# Patient Record
Sex: Female | Born: 1990 | Race: Black or African American | Hispanic: No | Marital: Single | Smoking: Former smoker
Health system: Southern US, Community
[De-identification: ages and names within clinical notes are randomized; demographics above are authoritative.]

## PROBLEM LIST (undated history)

## (undated) DIAGNOSIS — A64 Unspecified sexually transmitted disease: Secondary | ICD-10-CM

## (undated) DIAGNOSIS — D649 Anemia, unspecified: Secondary | ICD-10-CM

## (undated) DIAGNOSIS — K219 Gastro-esophageal reflux disease without esophagitis: Secondary | ICD-10-CM

## (undated) DIAGNOSIS — E282 Polycystic ovarian syndrome: Secondary | ICD-10-CM

## (undated) DIAGNOSIS — I10 Essential (primary) hypertension: Secondary | ICD-10-CM

## (undated) DIAGNOSIS — D219 Benign neoplasm of connective and other soft tissue, unspecified: Secondary | ICD-10-CM

## (undated) DIAGNOSIS — F419 Anxiety disorder, unspecified: Secondary | ICD-10-CM

## (undated) DIAGNOSIS — R87619 Unspecified abnormal cytological findings in specimens from cervix uteri: Secondary | ICD-10-CM

## (undated) DIAGNOSIS — F32A Depression, unspecified: Secondary | ICD-10-CM

## (undated) HISTORY — DX: Gastro-esophageal reflux disease without esophagitis: K21.9

## (undated) HISTORY — DX: Unspecified sexually transmitted disease: A64

## (undated) HISTORY — DX: Anxiety disorder, unspecified: F41.9

## (undated) HISTORY — DX: Polycystic ovarian syndrome: E28.2

## (undated) HISTORY — DX: Depression, unspecified: F32.A

## (undated) HISTORY — DX: Unspecified abnormal cytological findings in specimens from cervix uteri: R87.619

## (undated) HISTORY — PX: COLPOSCOPY: SHX161

## (undated) HISTORY — DX: Benign neoplasm of connective and other soft tissue, unspecified: D21.9

---

## 2015-03-15 DIAGNOSIS — E282 Polycystic ovarian syndrome: Secondary | ICD-10-CM | POA: Insufficient documentation

## 2016-09-28 DIAGNOSIS — N920 Excessive and frequent menstruation with regular cycle: Secondary | ICD-10-CM | POA: Insufficient documentation

## 2018-04-09 ENCOUNTER — Emergency Department (HOSPITAL_COMMUNITY)
Admission: EM | Admit: 2018-04-09 | Discharge: 2018-04-09 | Disposition: A | Discharge status: Home / Self Care | Payer: Self-pay | Attending: Emergency Medicine | Admitting: Emergency Medicine

## 2018-04-09 DIAGNOSIS — B9789 Other viral agents as the cause of diseases classified elsewhere: Secondary | ICD-10-CM

## 2018-04-09 DIAGNOSIS — J069 Acute upper respiratory infection, unspecified: Secondary | ICD-10-CM | POA: Insufficient documentation

## 2018-04-09 NOTE — Discharge Instructions (Addendum)
You are seen in the ER for cough.  Your symptoms are most likely from a virus that has caused an upper respiratory infection. A viral illness typically peaks on day 2-3 and resolves after one week.     The main treatment approach for a viral upper respiratory infection is to treat the symptoms, support your immune system and prevent spread of illness.    Stay well-hydrated. Rest. You can use over the counter medications to help with symptoms: 600 mg ibuprofen (motrin, aleve, advil) or acetaminophen (tylenol) every 6 hours, around the clock to help with associated fevers, sore throat, headaches, generalized body aches and malaise.  Oxymetazoline (afrin) intranasal spray once daily for no more than 3 days to help with congestion, after 3 days you can switch to another over-the-counter nasal steroid spray such as fluticasone (flonase) Allergy medication (loratadine, cetirizine, etc) and phenylephrine (sudafed) help with nasal congestion, runny nose and postnasal drip.   Dextromethorphan (Delsym) to suppress dry cough  Guaifenesin (mucinex) to help with built up mucus in chest and productive cough Wash your hands often to prevent spread.    A viral upper respiratory infection can also worsen and progress into pneumonia.  Monitor your symptoms. Return for persistent fevers, chest pain, productive cough, inability to tolerate fluids despite nausea medicines or dehydration

## 2018-04-09 NOTE — ED Notes (Signed)
See provider note for assessment

## 2018-04-09 NOTE — ED Provider Notes (Signed)
[facility] EMERGENCY DEPARTMENT Provider Note   CSN: [id number] Arrival date & time: 04/09/18  1751     History   Chief Complaint Chief Complaint  Patient presents with  . URI    HPI Melody Gates is a 28 y.o. female with history of hypertension is here for evaluation of cough.  Onset 4 days ago.  Persistent, dry, hacking in nature.  Associated with nasal congestion, clear rhinorrhea, subjective clamminess, intermittent chest tightness, irritated throat.  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  Has been taken Mucinex which is not helping.  She was afraid to go to work this morning due to her symptoms and came to the ER for evaluation.  She denies fevers, nausea, vomiting, abdominal pain, diarrhea, chest pain or shortness of breath. HPI  No past medical history on file.  There are no active problems to display for this patient.   ** The histories are not reviewed yet. Please review them in the "History" navigator section and refresh this SmartLink.   OB History   No obstetric history on file.      Home Medications    Prior to Admission medications   Not on File    Family History No family history on file.  Social History Social History   Tobacco Use  . Smoking status: Not on file  Substance Use Topics  . Alcohol use: Not on file  . Drug use: Not on file     Allergies   Penicillins and Amoxicillin   Review of Systems Review of Systems  HENT: Positive for congestion, postnasal drip and rhinorrhea.   Respiratory: Positive for cough.   All other systems reviewed and are negative.    Physical Exam Updated Vital Signs BP (!) 141/92 (BP Location: Left Arm)   Pulse 81   Temp 98.6 F (37 C) (Oral)   Resp 16   SpO2 99%   Physical Exam Vitals signs and nursing note reviewed.  Constitutional:      General: She  is not in acute distress.    Appearance: She is well-developed.     Comments: NAD.  HENT:     Head: Normocephalic and atraumatic.     Right Ear: External ear normal.     Left Ear: External ear normal.     Nose: Congestion present.     Comments: Sounds congested.  Moderate mucosal edema, erythema and clear rhinorrhea noted.  No sinus tenderness.    Mouth/Throat:     Comments: Moist mucous membranes.  Oropharynx and tonsils normal. Eyes:     General: No scleral icterus.    Conjunctiva/sclera: Conjunctivae normal.  Neck:     Musculoskeletal: Normal range of motion and neck supple.     Comments: No cervical lymphadenopathy. Cardiovascular:     Rate and Rhythm: Normal rate and regular rhythm.     Heart sounds: Normal heart sounds. No murmur.  Pulmonary:     Effort: Pulmonary effort is normal.     Breath sounds: Normal breath sounds. No wheezing.  Musculoskeletal: Normal range of motion.        General: No deformity.  Skin:    General: Skin is warm and dry.     Capillary Refill: Capillary refill takes less than 2 seconds.  Neurological:     Mental Status: She is alert and oriented to person, place, and time.  Psychiatric:        Behavior: Behavior normal.        Thought Content: Thought content normal.        Judgment: Judgment normal.      ED Treatments / Results  Labs (all labs ordered are listed, but only abnormal results are displayed) Labs Reviewed - No data to display  EKG None  Radiology No results found.  Procedures Procedures (including critical care time)  Medications Ordered in ED Medications - No data to display   Initial Impression / Assessment and Plan / ED Course  I have reviewed the triage vital signs and the nursing notes.  Pertinent labs & imaging results that were available during my care of the patient were reviewed by me and considered in my medical decision making (see chart for details).    28 year old is here with cough that is likely  viral in nature.  She began a new job with kindergarten children the day before symptoms began.  On exam patient is nontoxic, vital signs normal.  No adventitious sounds on lung exam.  I do not think there is indication for emergent chest x-ray given VS, benign exam.  She has no history of immunocompromise.  I doubt bacterial bronchitis or pneumonia.  I considered strep throat to be less likely given her throat exam today, cough and lack of fever.  We will discharge with symptomatic management.  ED return precautions discussed.  Patient is aware that a viral URI infection may precede pneumonia or worsening illness. Patient is aware of red flag symptoms to monitor for that would warrant return to the ED for further reevaluation. She is comfortable with this plan.    Final Clinical Impressions(s) / ED Diagnoses   Final diagnoses:  Viral URI with cough    ED Discharge Orders    None       Liberty Handy, PA-C 04/09/18 [id number]    Alvira Monday, MD 04/10/18 2128

## 2018-04-09 NOTE — ED Triage Notes (Signed)
Pt endorses cough, sore throat, sneezing x 4 days. Afebrile. VSS

## 2019-06-20 ENCOUNTER — Ambulatory Visit: Payer: Medicaid Other | Attending: Internal Medicine

## 2019-06-26 DIAGNOSIS — I10 Essential (primary) hypertension: Secondary | ICD-10-CM | POA: Insufficient documentation

## 2019-12-15 ENCOUNTER — Emergency Department (HOSPITAL_COMMUNITY): Payer: BC Managed Care – PPO

## 2019-12-15 ENCOUNTER — Encounter (HOSPITAL_COMMUNITY): Payer: Self-pay | Admitting: *Deleted

## 2019-12-15 ENCOUNTER — Other Ambulatory Visit: Payer: Self-pay

## 2019-12-15 ENCOUNTER — Emergency Department (HOSPITAL_COMMUNITY)
Admission: EM | Admit: 2019-12-15 | Discharge: 2019-12-16 | Disposition: A | Discharge status: Home / Self Care | Payer: BC Managed Care – PPO | Attending: Emergency Medicine | Admitting: Emergency Medicine

## 2019-12-15 DIAGNOSIS — R10814 Left lower quadrant abdominal tenderness: Secondary | ICD-10-CM | POA: Diagnosis not present

## 2019-12-15 DIAGNOSIS — R10813 Right lower quadrant abdominal tenderness: Secondary | ICD-10-CM | POA: Diagnosis not present

## 2019-12-15 DIAGNOSIS — N939 Abnormal uterine and vaginal bleeding, unspecified: Secondary | ICD-10-CM

## 2019-12-15 DIAGNOSIS — R10812 Left upper quadrant abdominal tenderness: Secondary | ICD-10-CM | POA: Diagnosis not present

## 2019-12-15 DIAGNOSIS — R10811 Right upper quadrant abdominal tenderness: Secondary | ICD-10-CM | POA: Insufficient documentation

## 2019-12-15 DIAGNOSIS — R10815 Periumbilic abdominal tenderness: Secondary | ICD-10-CM | POA: Insufficient documentation

## 2019-12-15 DIAGNOSIS — D500 Iron deficiency anemia secondary to blood loss (chronic): Secondary | ICD-10-CM

## 2019-12-15 DIAGNOSIS — Z20822 Contact with and (suspected) exposure to covid-19: Secondary | ICD-10-CM | POA: Insufficient documentation

## 2019-12-15 DIAGNOSIS — R10816 Epigastric abdominal tenderness: Secondary | ICD-10-CM | POA: Diagnosis not present

## 2019-12-15 DIAGNOSIS — Z87891 Personal history of nicotine dependence: Secondary | ICD-10-CM | POA: Insufficient documentation

## 2019-12-15 DIAGNOSIS — I10 Essential (primary) hypertension: Secondary | ICD-10-CM | POA: Insufficient documentation

## 2019-12-15 DIAGNOSIS — R102 Pelvic and perineal pain: Secondary | ICD-10-CM | POA: Diagnosis present

## 2019-12-15 HISTORY — DX: Anemia, unspecified: D64.9

## 2019-12-15 HISTORY — DX: Essential (primary) hypertension: I10

## 2019-12-15 LAB — CBC WITH DIFFERENTIAL/PLATELET
Abs Immature Granulocytes: 0.14 10*3/uL — ABNORMAL HIGH (ref 0.00–0.07)
Basophils Absolute: 0.1 10*3/uL (ref 0.0–0.1)
Basophils Relative: 0 %
Eosinophils Absolute: 0.1 10*3/uL (ref 0.0–0.5)
Eosinophils Relative: 0 %
HCT: 27.9 % — ABNORMAL LOW (ref 36.0–46.0)
Hemoglobin: 7.9 g/dL — ABNORMAL LOW (ref 12.0–15.0)
Immature Granulocytes: 1 %
Lymphocytes Relative: 9 %
Lymphs Abs: 2.1 10*3/uL (ref 0.7–4.0)
MCH: 20.8 pg — ABNORMAL LOW (ref 26.0–34.0)
MCHC: 28.3 g/dL — ABNORMAL LOW (ref 30.0–36.0)
MCV: 73.6 fL — ABNORMAL LOW (ref 80.0–100.0)
Monocytes Absolute: 0.6 10*3/uL (ref 0.1–1.0)
Monocytes Relative: 3 %
Neutro Abs: 20.4 10*3/uL — ABNORMAL HIGH (ref 1.7–7.7)
Neutrophils Relative %: 87 %
Platelets: 476 10*3/uL — ABNORMAL HIGH (ref 150–400)
RBC: 3.79 MIL/uL — ABNORMAL LOW (ref 3.87–5.11)
RDW: 18.2 % — ABNORMAL HIGH (ref 11.5–15.5)
WBC: 23.3 10*3/uL — ABNORMAL HIGH (ref 4.0–10.5)
nRBC: 0 % (ref 0.0–0.2)

## 2019-12-15 LAB — IRON AND TIBC
Iron: 16 ug/dL — ABNORMAL LOW (ref 28–170)
Saturation Ratios: 4 % — ABNORMAL LOW (ref 10.4–31.8)
TIBC: 416 ug/dL (ref 250–450)
UIBC: 400 ug/dL

## 2019-12-15 LAB — COMPREHENSIVE METABOLIC PANEL
ALT: 11 U/L (ref 0–44)
AST: 12 U/L — ABNORMAL LOW (ref 15–41)
Albumin: 4.4 g/dL (ref 3.5–5.0)
Alkaline Phosphatase: 45 U/L (ref 38–126)
Anion gap: 9 (ref 5–15)
BUN: 14 mg/dL (ref 6–20)
CO2: 23 mmol/L (ref 22–32)
Calcium: 9.5 mg/dL (ref 8.9–10.3)
Chloride: 101 mmol/L (ref 98–111)
Creatinine, Ser: 0.79 mg/dL (ref 0.44–1.00)
GFR calc Af Amer: >60 mL/min (ref >60)
GFR calc non Af Amer: >60 mL/min (ref >60)
Glucose, Bld: 116 mg/dL — ABNORMAL HIGH (ref 70–99)
Potassium: 3.7 mmol/L (ref 3.5–5.1)
Sodium: 133 mmol/L — ABNORMAL LOW (ref 135–145)
Total Bilirubin: 0.4 mg/dL (ref 0.3–1.2)
Total Protein: 8.6 g/dL — ABNORMAL HIGH (ref 6.5–8.1)

## 2019-12-15 LAB — RETICULOCYTES
Immature Retic Fract: 23.9 % — ABNORMAL HIGH (ref 2.3–15.9)
RBC.: 3.76 MIL/uL — ABNORMAL LOW (ref 3.87–5.11)
Retic Count, Absolute: 200 10*3/uL — ABNORMAL HIGH (ref 19.0–186.0)
Retic Ct Pct: 5.3 % — ABNORMAL HIGH (ref 0.4–3.1)

## 2019-12-15 LAB — WET PREP, GENITAL
Clue Cells Wet Prep HPF POC: NONE SEEN
Sperm: NONE SEEN
Trich, Wet Prep: NONE SEEN
Yeast Wet Prep HPF POC: NONE SEEN

## 2019-12-15 LAB — RESPIRATORY PANEL BY RT PCR (FLU A&B, COVID)
Influenza A by PCR: NEGATIVE
Influenza B by PCR: NEGATIVE
SARS Coronavirus 2 by RT PCR: NEGATIVE

## 2019-12-15 LAB — VITAMIN B12: Vitamin B-12: 182 pg/mL (ref 180–914)

## 2019-12-15 LAB — I-STAT BETA HCG BLOOD, ED (MC, WL, AP ONLY): I-stat hCG, quantitative: <5 m[IU]/mL (ref <5)

## 2019-12-15 LAB — SAMPLE TO BLOOD BANK

## 2019-12-15 LAB — FERRITIN: Ferritin: 16 ng/mL (ref 11–307)

## 2019-12-15 LAB — LACTIC ACID, PLASMA: Lactic Acid, Venous: 1.7 mmol/L (ref 0.5–1.9)

## 2019-12-15 LAB — FOLATE: Folate: 5 ng/mL — ABNORMAL LOW (ref >5.9)

## 2019-12-15 LAB — LIPASE, BLOOD: Lipase: 22 U/L (ref 11–51)

## 2019-12-15 MED ORDER — LACTATED RINGERS IV BOLUS
1000.0000 mL | Freq: Once | INTRAVENOUS | Status: AC
  Administered 2019-12-15: 1000 mL via INTRAVENOUS

## 2019-12-15 MED ORDER — NORGESTIMATE-ETH ESTRADIOL 0.25-35 MG-MCG PO TABS: ORAL_TABLET | ORAL | 0 refills | Status: DC

## 2019-12-15 MED ORDER — FERROUS SULFATE 325 (65 FE) MG PO TABS: 325.0000 mg | ORAL_TABLET | Freq: Every day | ORAL | 0 refills | Status: DC

## 2019-12-15 NOTE — ED Notes (Signed)
Pt asleep at this time

## 2019-12-15 NOTE — Discharge Instructions (Addendum)
Please stop taking your previous birth control.  Please go to the pharmacy to pick up the new birth control.  Take 2 pills every day for the next 7 days.  Please call your OB/GYN in the morning to set up a follow-up appointment.  I have given you a prescription for iron also.  Please make sure in addition to that you are taking a multivitamin.

## 2019-12-15 NOTE — ED Provider Notes (Signed)
[facility]-EMERGENCY DEPT Provider Note   CSN: [id number] Arrival date & time: 12/15/19  1552     History Chief Complaint  Patient presents with  . Abdominal Pain    Melody DankerMelissa Gates is a 29 y.o. female with a past medical history of anemia, hypertension, [facility] who presents today for evaluation of pelvic pain.  She reports that today she woke up feeling normal however throughout the day she has developed lower abdominal pain.  She states that it is the same on each side.  It does not radiate or move.  She states that over the past 5 days she has been short of breath.  She states that she has doubled up her iron pills over the past 5 days, however states that she used to get iron infusions however has not had any recently.  She reports that originally she was bleeding for 83 days with large clots.  After she saw OB/GYN and was put on Slynd, a progesterone only birth control pill she stopped for 2 weeks, however over the past 6 weeks has had bleeding daily.   She states that today she is not having significant bleeding, is very light spotting, however while here she was able to feel herself passing clots.  She states that on a normal day she changes an overnight pad once every hour..  She is sexually active, does not use barrier protection such as condoms.  She states that she had a Covid test due to the shortness of breath however that came back negative.  She denies any pain in her chest.  She reports that when she changes position she feels very lightheaded and is unable to walk 5 steps to the bathroom.   HPI     Past Medical History:  Diagnosis Date  . Anemia   . Hypertension     There are no problems to display for this patient.   History reviewed. No pertinent surgical history.   OB History   No obstetric history on file.     No family history on file.  Social History   Tobacco Use  . Smoking status: Former Games developermoker    . Smokeless tobacco: Never Used  Vaping Use  . Vaping Use: Never used  Substance Use Topics  . Alcohol use: Not Currently  . Drug use: Never    Home Medications Prior to Admission medications   Medication Sig Start Date End Date Taking? Authorizing Provider  cholecalciferol (VITAMIN D3) 25 MCG (1000 UNIT) tablet Take 1,000 Units by mouth daily.   Yes [provider]  Ferrous Gluconate (IRON 27 PO) Take 1 tablet by mouth daily.   Yes [provider]  hydrochlorothiazide (HYDRODIURIL) 25 MG tablet Take 25 mg by mouth daily. 12/12/19  Yes [provider]  losartan (COZAAR) 100 MG tablet Take 100 mg by mouth daily. 12/12/19  Yes [provider]  SLYND 4 MG TABS Take 1 tablet by mouth daily. 11/13/19  Yes [provider]  ferrous sulfate 325 (65 FE) MG tablet Take 1 tablet (325 mg total) by mouth daily. 12/15/19   Cristina GongHammond, Yaseen Gilberg W, PA-C  norgestimate-ethinyl estradiol (ORTHO-CYCLEN) 0.25-35 MG-MCG tablet Take 2 tablets by mouth daily for 7 days, THEN 1 tablet daily for 7 days. 12/15/19 12/29/19  Cristina GongHammond, Masie Bermingham W, PA-C    Allergies    Penicillins and Amoxicillin  Review of Systems   Review of Systems  Constitutional: Positive for fatigue. Negative for  chills and fever.  HENT: Negative for congestion.   Eyes: Negative for visual disturbance.  Respiratory: Positive for shortness of breath. Negative for cough and chest tightness.   Cardiovascular: Negative for chest pain, palpitations and leg swelling.  Gastrointestinal: Positive for abdominal pain and nausea. Negative for diarrhea and vomiting.  Genitourinary: Positive for menstrual problem, pelvic pain and vaginal bleeding. Negative for decreased urine volume, dysuria, flank pain, vaginal discharge and vaginal pain.  Musculoskeletal: Negative for back pain and neck pain.  Skin: Negative for color change and rash.  Neurological: Negative for weakness and headaches.  All other systems  reviewed and are negative.   Physical Exam Updated Vital Signs BP (!) 158/90 (BP Location: Right Arm)   Pulse 84   Temp 99.4 F (37.4 C) (Rectal)   Resp 15   Ht 5\' 6"  (1.676 m)   Wt 130.2 kg   SpO2 100%   BMI 46.32 kg/m   Physical Exam Vitals and nursing note reviewed. Exam conducted with a chaperone present.  Constitutional:      General: She is not in acute distress.    Appearance: She is well-developed.  HENT:     Head: Normocephalic and atraumatic.  Eyes:     Conjunctiva/sclera: Conjunctivae normal.  Cardiovascular:     Rate and Rhythm: Normal rate and regular rhythm.     Heart sounds: No murmur heard.   Pulmonary:     Effort: Pulmonary effort is normal. No respiratory distress.     Breath sounds: Normal breath sounds.  Abdominal:     General: Abdomen is flat.     Palpations: Abdomen is soft.     Tenderness: There is abdominal tenderness in the right upper quadrant, right lower quadrant, epigastric area, periumbilical area, suprapubic area, left upper quadrant and left lower quadrant. There is no guarding or rebound.  Genitourinary:    Comments: There is a golf ball sized clot in the vaginal canal.  No active bleeding from the cervix.  Normal external female genitalia.  No pain or fullness in bilateral adnexa.  No cervical motion tenderness. Musculoskeletal:     Cervical back: Neck supple.  Skin:    General: Skin is warm and dry.  Neurological:     General: No focal deficit present.     Mental Status: She is alert.     Cranial Nerves: No cranial nerve deficit.  Psychiatric:        Mood and Affect: Mood normal.        Behavior: Behavior normal.     ED Results / Procedures / Treatments   Labs (all labs ordered are listed, but only abnormal results are displayed) Labs Reviewed  WET PREP, GENITAL - Abnormal; Notable for the following components:      Result Value   WBC, Wet Prep HPF POC FEW (*)    All other components within normal limits  CBC WITH  DIFFERENTIAL/PLATELET - Abnormal; Notable for the following components:   WBC 23.3 (*)    RBC 3.79 (*)    Hemoglobin 7.9 (*)    HCT 27.9 (*)    MCV 73.6 (*)    MCH 20.8 (*)    MCHC 28.3 (*)    RDW 18.2 (*)    Platelets 476 (*)    Neutro Abs 20.4 (*)    Abs Immature Granulocytes 0.14 (*)    All other components within normal limits  COMPREHENSIVE METABOLIC PANEL - Abnormal; Notable for the following components:   Sodium 133 (*)  Glucose, Bld 116 (*)    Total Protein 8.6 (*)    AST 12 (*)    All other components within normal limits  FOLATE - Abnormal; Notable for the following components:   Folate 5.0 (*)    All other components within normal limits  IRON AND TIBC - Abnormal; Notable for the following components:   Iron 16 (*)    Saturation Ratios 4 (*)    All other components within normal limits  RETICULOCYTES - Abnormal; Notable for the following components:   Retic Ct Pct 5.3 (*)    RBC. 3.76 (*)    Retic Count, Absolute 200.0 (*)    Immature Retic Fract 23.9 (*)    All other components within normal limits  RESPIRATORY PANEL BY RT PCR (FLU A&B, COVID)  LIPASE, BLOOD  VITAMIN B12  FERRITIN  LACTIC ACID, PLASMA  URINALYSIS, ROUTINE W REFLEX MICROSCOPIC  I-STAT BETA HCG BLOOD, ED ([facility], [facility], [facility] ONLY)  SAMPLE TO BLOOD BANK  GC/CHLAMYDIA PROBE AMP ([place]) NOT AT [facility]   Patient has records from [facility].  On 10/17/2019 her white counts were 17.8, hemoglobin of 11.0, hematocrit of 36.3, platelets of 391.   EKG EKG Interpretation  Date/Time:  Sunday December 15 2019 17:29:21 EDT Ventricular Rate:  87 PR Interval:    QRS Duration: 82 QT Interval:  405 QTC Calculation: 488 R Axis:   -7 Text Interpretation: Sinus rhythm Borderline prolonged QT interval Confirmed by Donnetta Hutching ([id number]) on 12/15/2019 7:51:29 PM   Radiology DG Chest Port 1 View  Result Date: 12/15/2019 CLINICAL DATA:  29 y.o. female states shortness of breath since Friday 12/13/19. Former  smoker. Hx HTN EXAM: PORTABLE CHEST 1 VIEW COMPARISON:  None. FINDINGS: The cardiomediastinal contours are within normal limits. The lungs are clear. No pneumothorax or significant pleural effusion. No acute finding in the visualized skeleton. IMPRESSION: No acute cardiopulmonary finding. Electronically Signed   By: Emmaline Kluver M.D.   On: 12/15/2019 17:31    Procedures Procedures (including critical care time)  Medications Ordered in ED Medications  lactated ringers bolus 1,000 mL (1,000 mLs Intravenous New Bag/Given 12/15/19 1838)    ED Course  I have reviewed the triage vital signs and the nursing notes.  Pertinent labs & imaging results that were available during my care of the patient were reviewed by me and considered in my medical decision making (see chart for details).  Clinical Course as of Dec 15 2230  Wynelle Link Dec 15, 2019  1723 Patient reports this is a normal for her.  On repeat exam her abdominal pain is significantly improved.  Her abdomen is soft nontender nondistended.  WBC(!): 23.3 [EH]  1753 White 17.8, hemoglob 11.0, crit 36.3, platelets 391 10/17/19   [EH]  1757 Dr. Chestine Spore with [facility] is her primary OB/GYN.   [EH]  1759 Slynd OPC on placebo week, progesterone only pill   [EH]  2155 I spoke with Dr. Henderson Cloud of [facility].  She recommends starting patient on Sprintec, and having her take two pills daily for at least the next few days and call her OB/GYN in the morning.   [EH]    Clinical Course User Index [EH] Norman Clay   MDM Rules/Calculators/A&P                         Patient is a 29 year old woman who presents today for evaluation of abdominal pain worsening since  this morning.  Additionally she reports shortness of breath and fatigue and describes what sounds like orthostatic changes.  On initial exam patient appeared uncomfortable and her abdomen was generally tender, however while in the emergency room prior to  initiation of supportive or symptomatic treatment her pain fully resolved.  Torsion is considered, however given the spontaneous resolution, bilateral nature along with diffuse abdominal pain rather than pelvic or lower abdominal pain this is felt to be less likely.    Labs are obtained and reviewed, she has a leukocytosis of 23.3, however she states that she has chronic leukocytosis, that 23.3 is a normal white count for her, that this has previously been worked up by [employer] at outside facility. She does have prior labs from 10/17/2019 that show her white count was 17.8.  On her labs from 8/5 her hemoglobin was 11.  Here today her hemoglobin is 7.9 and I suspect she has an element of hemoconcentration additionally as her platelets then were 391 and today are 476.  She did have a clot on pelvic exam however is not actively bleeding from the cervix.  I suspect that she has some dehydration from feeling poorly causing a slight hemoconcentration and that this is more of a gradual process than a acute blood loss.  Patient is treated with 1 L of IV fluids.  Her initial orthostatic vitals did not show a significant change in heart rate or blood pressure that was recorded patient did feel symptomatically lightheaded and according to nursing staff her heart rate elevated significantly after she stood for over a minute.  This was observed by myself in the room when patient stood up to get undressed for pelvic exam her heart rate jumped into the 120s.    Patient was treated with 1 L of IV fluids after which she felt better and was much less symptomatic for orthostatics. Anemia panel is sent. I spoke with Dr. Henderson Cloud call for [facility] or patient is established, she recommended stopping patient's progesterone only OCPs, starting her on Sprintec and having her take 2 pills a day for at least the next few days and call her primary OB/GYN in the morning.  At this point patient is currently p.o. challenging.   If patient is able to ambulate in the hallway without difficulty and her urine is unremarkable anticipate discharge home.  If patient becomes symptomatic with ambulation and is unable to safely walk to the bathroom then anticipate transfusing 1 unit of blood prior to discharge.  At shift change care was transferred to Sharilyn Sites who will follow pending studies, if patient is able to ambulate safely discharge patient.   Return precautions were discussed with patient who states their understanding.  Patient denied any unaddressed complaints or concerns.  Patient is agreeable for discharge home.  Note: Portions of this report may have been transcribed using voice recognition software. Every effort was made to ensure accuracy; however, inadvertent computerized transcription errors may be present   Final Clinical Impression(s) / ED Diagnoses Final diagnoses:  Vaginal bleeding  Iron deficiency anemia due to chronic blood loss    Rx / DC Orders ED Discharge Orders         Ordered    norgestimate-ethinyl estradiol (ORTHO-CYCLEN) 0.25-35 MG-MCG tablet        12/15/19 2212    ferrous sulfate 325 (65 FE) MG tablet  Daily        12/15/19 2212    Ambulatory referral to Hematology  12/15/19 2214           Cristina Gong, PA-C 12/15/19 2242    Donnetta Hutching, MD 12/19/19 1655

## 2019-12-15 NOTE — ED Triage Notes (Signed)
Pt BIB EMS and presents abd pain that got worse this morning.  Pt reports being on birth control pills that had her bleeding for 83 days.  Pt had a pause in her bleeding after her OB/GYN switched her medication but the bleeding has resumed x 2 months.  EMS denies N/V but does report bilateral lower abd pain is tender to palpation. Pt reports regular BM and urination.

## 2019-12-16 LAB — URINALYSIS, ROUTINE W REFLEX MICROSCOPIC
Bacteria, UA: NONE SEEN
Bilirubin Urine: NEGATIVE
Glucose, UA: NEGATIVE mg/dL
Ketones, ur: NEGATIVE mg/dL
Leukocytes,Ua: NEGATIVE
Nitrite: NEGATIVE
Protein, ur: NEGATIVE mg/dL
Specific Gravity, Urine: 1.029 (ref 1.005–1.030)
pH: 5 (ref 5.0–8.0)

## 2019-12-16 LAB — GC/CHLAMYDIA PROBE AMP (~~LOC~~) NOT AT ARMC
Chlamydia: NEGATIVE
Comment: NEGATIVE
Comment: NORMAL
Neisseria Gonorrhea: POSITIVE — AB

## 2019-12-16 NOTE — ED Provider Notes (Signed)
  Patient has ambulated here multiple times without issue.  No hypotension or near syncope.  UA without signs of infection.  Plan to d/c home with OB-GYN follow-up as per prior team.  Return here for any new/acute changes.   Garlon Hatchet, PA-C 12/16/19 3785    Geoffery Lyons, MD 12/16/19 [id number]

## 2019-12-16 NOTE — ED Notes (Signed)
Pt ambulated to restroom without incident to provide urine sample 

## 2020-01-06 ENCOUNTER — Telehealth: Payer: Self-pay | Admitting: Oncology

## 2020-01-06 NOTE — Telephone Encounter (Signed)
Received a new hem referral from [facility] ED for iron deficiency. Melody Gates has been scheduled to see Dr. Clelia Croft on 11/17 at 11am. Pt aware to arrive 20 minutes early,

## 2020-01-29 ENCOUNTER — Telehealth: Payer: Self-pay

## 2020-01-29 ENCOUNTER — Other Ambulatory Visit: Payer: Self-pay

## 2020-01-29 ENCOUNTER — Inpatient Hospital Stay: Payer: BC Managed Care – PPO

## 2020-01-29 ENCOUNTER — Inpatient Hospital Stay: Payer: BC Managed Care – PPO | Attending: Oncology | Admitting: Oncology

## 2020-01-29 VITALS — BP 136/79 | HR 94 | Temp 99.3°F | Resp 18 | Ht 66.0 in | Wt 293.3 lb

## 2020-01-29 DIAGNOSIS — I10 Essential (primary) hypertension: Secondary | ICD-10-CM | POA: Diagnosis not present

## 2020-01-29 DIAGNOSIS — Z87891 Personal history of nicotine dependence: Secondary | ICD-10-CM | POA: Insufficient documentation

## 2020-01-29 DIAGNOSIS — D509 Iron deficiency anemia, unspecified: Secondary | ICD-10-CM

## 2020-01-29 DIAGNOSIS — N920 Excessive and frequent menstruation with regular cycle: Secondary | ICD-10-CM | POA: Diagnosis not present

## 2020-01-29 DIAGNOSIS — Z79899 Other long term (current) drug therapy: Secondary | ICD-10-CM | POA: Diagnosis not present

## 2020-01-29 LAB — CBC WITH DIFFERENTIAL (CANCER CENTER ONLY)
Abs Immature Granulocytes: 0.07 10*3/uL (ref 0.00–0.07)
Basophils Absolute: 0.1 10*3/uL (ref 0.0–0.1)
Basophils Relative: 0 %
Eosinophils Absolute: 0.6 10*3/uL — ABNORMAL HIGH (ref 0.0–0.5)
Eosinophils Relative: 4 %
HCT: 23.7 % — ABNORMAL LOW (ref 36.0–46.0)
Hemoglobin: 6.8 g/dL — CL (ref 12.0–15.0)
Immature Granulocytes: 1 %
Lymphocytes Relative: 24 %
Lymphs Abs: 3 10*3/uL (ref 0.7–4.0)
MCH: 18 pg — ABNORMAL LOW (ref 26.0–34.0)
MCHC: 28.7 g/dL — ABNORMAL LOW (ref 30.0–36.0)
MCV: 62.7 fL — ABNORMAL LOW (ref 80.0–100.0)
Monocytes Absolute: 0.6 10*3/uL (ref 0.1–1.0)
Monocytes Relative: 5 %
Neutro Abs: 8.3 10*3/uL — ABNORMAL HIGH (ref 1.7–7.7)
Neutrophils Relative %: 66 %
Platelet Count: 538 10*3/uL — ABNORMAL HIGH (ref 150–400)
RBC: 3.78 MIL/uL — ABNORMAL LOW (ref 3.87–5.11)
RDW: 17.6 % — ABNORMAL HIGH (ref 11.5–15.5)
WBC Count: 12.5 10*3/uL — ABNORMAL HIGH (ref 4.0–10.5)
nRBC: 0 % (ref 0.0–0.2)

## 2020-01-29 LAB — FERRITIN: Ferritin: 10 ng/mL — ABNORMAL LOW (ref 11–307)

## 2020-01-29 LAB — IRON AND TIBC
Iron: 10 ug/dL — ABNORMAL LOW (ref 41–142)
Saturation Ratios: 3 % — ABNORMAL LOW (ref 21–57)
TIBC: 359 ug/dL (ref 236–444)
UIBC: 349 ug/dL (ref 120–384)

## 2020-01-29 NOTE — Telephone Encounter (Signed)
CRITICAL VALUE STICKER  CRITICAL VALUE: Hemoglobin 6.8  RECEIVER (on-site recipient of call): Amanda Pea, RN  DATE & TIME NOTIFIED: 01/29/2020 @ 1117  MESSENGER (representative from lab): Knute Neu  MD NOTIFIED: Dr. Eli Hose  TIME OF NOTIFICATION: 1120  RESPONSE: Acknowledged. No further instructions received at this time.

## 2020-01-29 NOTE — Progress Notes (Signed)
Reason for the request:   Anemia  HPI: I was asked by Lyndel Safe, PA-C   to evaluate Melody Gates with a diagnosis of iron deficiency anemia.  She is a 29 year old woman with history of hypertension and iron deficiency anemia.  She was seen by hematologist in the past and has received intravenous iron because of lack of poor iron absorption previously.  She was seen in the emergency department on December 15, 2019 for abdominal pain and found to have a hemoglobin of 7.9 on a CBC.  Her white cell count was 23 with a platelet count of 476.  Iron studies at that time showed iron level of 16 with a saturation of 4%.  Ferritin was 16 at that time.  CBC obtained on July 7 showed a hemoglobin of 10.3, MCV of 75 and white cell count of 18.5.  Differential showed absolute neutrophilia but no other abnormalities.  She is currently on oral iron replacement although it has not been effective.  She has reported symptoms of fatigue tiredness and occasional dizziness.  She does report ice cravings and does not report any GI bleeding.  She denies hematochezia or melena.  She has reported heavy menstrual cycles but have slowed down after being on Provera.  She has follow-up with gynecology regarding this issue.  She does not report any headaches, blurry vision, syncope or seizures. Does not report any fevers, chills or sweats.  Does not report any cough, wheezing or hemoptysis.  Does not report any chest pain, palpitation, orthopnea or leg edema.  Does not report any nausea, vomiting or abdominal pain.  Does not report any constipation or diarrhea.  Does not report any skeletal complaints.    Does not report frequency, urgency or hematuria.  Does not report any skin rashes or lesions. Does not report any heat or cold intolerance.  Does not report any lymphadenopathy or petechiae.  Does not report any anxiety or depression.  Remaining review of systems is negative.    Past Medical History:  Diagnosis Date  . Anemia   .  Hypertension   :  No past surgical history on file.:   Current Outpatient Medications:  .  cholecalciferol (VITAMIN D3) 25 MCG (1000 UNIT) tablet, Take 1,000 Units by mouth daily., Disp: , Rfl:  .  Ferrous Gluconate (IRON 27 PO), Take 1 tablet by mouth daily., Disp: , Rfl:  .  ferrous sulfate 325 (65 FE) MG tablet, Take 1 tablet (325 mg total) by mouth daily., Disp: 30 tablet, Rfl: 0 .  hydrochlorothiazide (HYDRODIURIL) 25 MG tablet, Take 25 mg by mouth daily., Disp: , Rfl:  .  losartan (COZAAR) 100 MG tablet, Take 100 mg by mouth daily., Disp: , Rfl:  .  norgestimate-ethinyl estradiol (ORTHO-CYCLEN) 0.25-35 MG-MCG tablet, Take 2 tablets by mouth daily for 7 days, THEN 1 tablet daily for 7 days., Disp: 28 tablet, Rfl: 0 .  SLYND 4 MG TABS, Take 1 tablet by mouth daily., Disp: , Rfl: :  Allergies  Allergen Reactions  . Penicillins Swelling  . Amoxicillin   :  No family history on file.:  Social History   Socioeconomic History  . Marital status: Single    Spouse name: Not on file  . Number of children: Not on file  . Years of education: Not on file  . Highest education level: Not on file  Occupational History  . Not on file  Tobacco Use  . Smoking status: Former Games developer  . Smokeless tobacco: Never Used  Vaping Use  . Vaping Use: Never used  Substance and Sexual Activity  . Alcohol use: Not Currently  . Drug use: Never  . Sexual activity: Not on file  Other Topics Concern  . Not on file  Social History Narrative  . Not on file   Social Determinants of Health   Financial Resource Strain:   . Difficulty of Paying Living Expenses: Not on file  Food Insecurity:   . Worried About Programme researcher, broadcasting/film/video in the Last Year: Not on file  . Ran Out of Food in the Last Year: Not on file  Transportation Needs:   . Lack of Transportation (Medical): Not on file  . Lack of Transportation (Non-Medical): Not on file  Physical Activity:   . Days of Exercise per Week: Not on file  .  Minutes of Exercise per Session: Not on file  Stress:   . Feeling of Stress : Not on file  Social Connections:   . Frequency of Communication with Friends and Family: Not on file  . Frequency of Social Gatherings with Friends and Family: Not on file  . Attends Religious Services: Not on file  . Active Member of Clubs or Organizations: Not on file  . Attends Banker Meetings: Not on file  . Marital Status: Not on file  Intimate Partner Violence:   . Fear of Current or Ex-Partner: Not on file  . Emotionally Abused: Not on file  . Physically Abused: Not on file  . Sexually Abused: Not on file  :  Pertinent items are noted in HPI.  Exam: Blood pressure 136/79, pulse 94, temperature 99.3 F (37.4 C), temperature source Tympanic, resp. rate 18, height 5\' 6"  (1.676 m), weight 293 lb 4.8 oz (133 kg), SpO2 100 %.   ECOG 0 General appearance: alert and cooperative appeared without distress. Head: atraumatic without any abnormalities. Eyes: conjunctivae/corneas clear. PERRL.  Sclera anicteric. Throat: lips, mucosa, and tongue normal; without oral thrush or ulcers. Resp: clear to auscultation bilaterally without rhonchi, wheezes or dullness to percussion. Cardio: regular rate and rhythm, S1, S2 normal, no murmur, click, rub or gallop GI: soft, non-tender; bowel sounds normal; no masses,  no organomegaly Skin: Skin color, texture, turgor normal. No rashes or lesions Lymph nodes: Cervical, supraclavicular, and axillary nodes normal. Neurologic: Grossly normal without any motor, sensory or deep tendon reflexes. Musculoskeletal: No joint deformity or effusion.    Assessment and Plan:   29 year old woman with:  1.  Iron deficiency anemia noted in October 2021.  She presented with a hemoglobin of 7.9, iron level of 16 with a ferritin of 16.  Her MCV is 73.6.  The differential diagnosis for these findings were reviewed.  Iron deficiency related to chronic menstrual blood losses  remain the most likely etiology at this time.  Treatment options were reviewed including continuing oral iron therapy versus intravenous iron.  Risks and benefits of IV iron infusion were discussed.  Complications include arthralgias, myalgias and rarely anaphylaxis were reiterated.  After discussion today, she is agreeable to proceed with intravenous iron infusion.  Feraheme formulation will be scheduled with potential for alternative formulation could be used.   2.  Leukocytosis: Most likely reactive in nature related to her iron deficiency and illness.  I do not anticipate a myeloproliferative disorder at this time.  We will continue to monitor on future counts.   3.  Thrombocytosis: Related to her iron deficiency which I anticipate will correct after fixing her deficiency.  4.  Menorrhagia: She has follow-up with OB/GYN regarding this issue and has been on progesterone only control.  5.  Follow-up: Will be in 3 months for repeat follow-up.  45  minutes were dedicated to this visit. The time was spent on reviewing laboratory data, discussing treatment options, discussing differential diagnosis and answering questions regarding future plan.     A copy of this consult has been forwarded to the requesting provider

## 2020-01-31 ENCOUNTER — Telehealth: Payer: Self-pay | Admitting: Oncology

## 2020-01-31 NOTE — Telephone Encounter (Signed)
Scheduled per los, patient has been called and notified. 

## 2020-02-03 ENCOUNTER — Other Ambulatory Visit: Payer: Self-pay | Admitting: Oncology

## 2020-02-07 ENCOUNTER — Inpatient Hospital Stay: Payer: BC Managed Care – PPO

## 2020-02-07 ENCOUNTER — Other Ambulatory Visit: Payer: Self-pay

## 2020-02-07 VITALS — BP 116/83 | HR 94 | Temp 99.5°F | Resp 18

## 2020-02-07 DIAGNOSIS — D509 Iron deficiency anemia, unspecified: Secondary | ICD-10-CM

## 2020-02-07 MED ORDER — SODIUM CHLORIDE 0.9 % IV SOLN
Freq: Once | INTRAVENOUS | Status: AC
  Filled 2020-02-07: qty 250

## 2020-02-07 MED ORDER — SODIUM CHLORIDE 0.9 % IV SOLN
125.0000 mg | Freq: Once | INTRAVENOUS | Status: AC
  Administered 2020-02-07: 125 mg via INTRAVENOUS
  Filled 2020-02-07: qty 10

## 2020-02-07 NOTE — Patient Instructions (Signed)
Sodium Ferric Gluconate Complex injection What is this medicine? SODIUM FERRIC GLUCONATE COMPLEX (SOE dee um FER ik GLOO koe nate KOM pleks) is an iron replacement. It is used with epoetin therapy to treat low iron levels in patients who are receiving hemodialysis. This medicine may be used for other purposes; ask your health care provider or pharmacist if you have questions. COMMON BRAND NAME(S): Ferrlecit, Nulecit What should I tell my health care provider before I take this medicine? They need to know if you have any of the following conditions:  anemia that is not from iron deficiency  high levels of iron in the body  an unusual or allergic reaction to iron, benzyl alcohol, other medicines, foods, dyes, or preservatives  pregnant or are trying to become pregnant  breast-feeding How should I use this medicine? This medicine is for infusion into a vein. It is given by a health care professional in a hospital or clinic setting. Talk to your pediatrician regarding the use of this medicine in children. While this drug may be prescribed for children as young as 6 years old for selected conditions, precautions do apply. Overdosage: If you think you have taken too much of this medicine contact a poison control center or emergency room at once. NOTE: This medicine is only for you. Do not share this medicine with others. What if I miss a dose? It is important not to miss your dose. Call your doctor or health care professional if you are unable to keep an appointment. What may interact with this medicine? Do not take this medicine with any of the following medications:  deferoxamine  dimercaprol  other iron products This medicine may also interact with the following medications:  chloramphenicol  deferasirox  medicine for blood pressure like enalapril This list may not describe all possible interactions. Give your health care provider a list of all the medicines, herbs,  non-prescription drugs, or dietary supplements you use. Also tell them if you smoke, drink alcohol, or use illegal drugs. Some items may interact with your medicine. What should I watch for while using this medicine? Your condition will be monitored carefully while you are receiving this medicine. Visit your doctor for check-ups as directed. What side effects may I notice from receiving this medicine? Side effects that you should report to your doctor or health care professional as soon as possible:  allergic reactions like skin rash, itching or hives, swelling of the face, lips, or tongue  breathing problems  changes in hearing  changes in vision  chills, flushing, or sweating  fast, irregular heartbeat  feeling faint or lightheaded, falls  fever, flu-like symptoms  high or low blood pressure  pain, tingling, numbness in the hands or feet  severe pain in the chest, back, flanks, or groin  swelling of the ankles, feet, hands  trouble passing urine or change in the amount of urine  unusually weak or tired Side effects that usually do not require medical attention (report to your doctor or health care professional if they continue or are bothersome):  cramps  dark colored stools  diarrhea  headache  nausea, vomiting  stomach upset This list may not describe all possible side effects. Call your doctor for medical advice about side effects. You may report side effects to FDA at 1-800-FDA-1088. Where should I keep my medicine? This drug is given in a hospital or clinic and will not be stored at home. NOTE: This sheet is a summary. It may not cover all   possible information. If you have questions about this medicine, talk to your doctor, pharmacist, or health care provider.  2020 Elsevier/Gold Standard (2007-10-31 15:58:57)  

## 2020-02-13 ENCOUNTER — Inpatient Hospital Stay: Payer: BC Managed Care – PPO | Attending: Oncology

## 2020-02-13 ENCOUNTER — Other Ambulatory Visit: Payer: Self-pay

## 2020-02-13 VITALS — BP 133/84 | HR 77 | Temp 98.8°F | Resp 16

## 2020-02-13 DIAGNOSIS — D509 Iron deficiency anemia, unspecified: Secondary | ICD-10-CM | POA: Insufficient documentation

## 2020-02-13 MED ORDER — SODIUM CHLORIDE 0.9 % IV SOLN
125.0000 mg | Freq: Once | INTRAVENOUS | Status: AC
  Administered 2020-02-13: 125 mg via INTRAVENOUS
  Filled 2020-02-13: qty 10

## 2020-02-13 MED ORDER — SODIUM CHLORIDE 0.9 % IV SOLN
Freq: Once | INTRAVENOUS | Status: AC
  Filled 2020-02-13: qty 250

## 2020-02-13 NOTE — Patient Instructions (Signed)
Sodium Ferric Gluconate Complex injection What is this medicine? SODIUM FERRIC GLUCONATE COMPLEX (SOE dee um FER ik GLOO koe nate KOM pleks) is an iron replacement. It is used with epoetin therapy to treat low iron levels in patients who are receiving hemodialysis. This medicine may be used for other purposes; ask your health care provider or pharmacist if you have questions. COMMON BRAND NAME(S): Ferrlecit, Nulecit What should I tell my health care provider before I take this medicine? They need to know if you have any of the following conditions:  anemia that is not from iron deficiency  high levels of iron in the body  an unusual or allergic reaction to iron, benzyl alcohol, other medicines, foods, dyes, or preservatives  pregnant or are trying to become pregnant  breast-feeding How should I use this medicine? This medicine is for infusion into a vein. It is given by a health care professional in a hospital or clinic setting. Talk to your pediatrician regarding the use of this medicine in children. While this drug may be prescribed for children as young as 6 years old for selected conditions, precautions do apply. Overdosage: If you think you have taken too much of this medicine contact a poison control center or emergency room at once. NOTE: This medicine is only for you. Do not share this medicine with others. What if I miss a dose? It is important not to miss your dose. Call your doctor or health care professional if you are unable to keep an appointment. What may interact with this medicine? Do not take this medicine with any of the following medications:  deferoxamine  dimercaprol  other iron products This medicine may also interact with the following medications:  chloramphenicol  deferasirox  medicine for blood pressure like enalapril This list may not describe all possible interactions. Give your health care provider a list of all the medicines, herbs,  non-prescription drugs, or dietary supplements you use. Also tell them if you smoke, drink alcohol, or use illegal drugs. Some items may interact with your medicine. What should I watch for while using this medicine? Your condition will be monitored carefully while you are receiving this medicine. Visit your doctor for check-ups as directed. What side effects may I notice from receiving this medicine? Side effects that you should report to your doctor or health care professional as soon as possible:  allergic reactions like skin rash, itching or hives, swelling of the face, lips, or tongue  breathing problems  changes in hearing  changes in vision  chills, flushing, or sweating  fast, irregular heartbeat  feeling faint or lightheaded, falls  fever, flu-like symptoms  high or low blood pressure  pain, tingling, numbness in the hands or feet  severe pain in the chest, back, flanks, or groin  swelling of the ankles, feet, hands  trouble passing urine or change in the amount of urine  unusually weak or tired Side effects that usually do not require medical attention (report to your doctor or health care professional if they continue or are bothersome):  cramps  dark colored stools  diarrhea  headache  nausea, vomiting  stomach upset This list may not describe all possible side effects. Call your doctor for medical advice about side effects. You may report side effects to FDA at 1-800-FDA-1088. Where should I keep my medicine? This drug is given in a hospital or clinic and will not be stored at home. NOTE: This sheet is a summary. It may not cover all   possible information. If you have questions about this medicine, talk to your doctor, pharmacist, or health care provider.  2020 Elsevier/Gold Standard (2007-10-31 15:58:57)  

## 2020-02-13 NOTE — Progress Notes (Signed)
Pt stable at discharge.  VSS.  No complaints.  Ambulatory to lobby.

## 2020-02-20 ENCOUNTER — Inpatient Hospital Stay: Payer: BC Managed Care – PPO

## 2020-02-20 ENCOUNTER — Other Ambulatory Visit: Payer: Self-pay

## 2020-02-20 VITALS — BP 126/79 | HR 84 | Temp 98.9°F | Resp 18

## 2020-02-20 DIAGNOSIS — D509 Iron deficiency anemia, unspecified: Secondary | ICD-10-CM

## 2020-02-20 MED ORDER — SODIUM CHLORIDE 0.9 % IV SOLN
125.0000 mg | Freq: Once | INTRAVENOUS | Status: AC
  Administered 2020-02-20: 125 mg via INTRAVENOUS
  Filled 2020-02-20: qty 10

## 2020-02-20 MED ORDER — SODIUM CHLORIDE 0.9 % IV SOLN
Freq: Once | INTRAVENOUS | Status: AC
  Filled 2020-02-20: qty 250

## 2020-02-20 NOTE — Progress Notes (Signed)
Patient discharged in stable condition.

## 2020-02-20 NOTE — Patient Instructions (Signed)

## 2020-04-02 ENCOUNTER — Telehealth: Payer: Self-pay | Admitting: Oncology

## 2020-04-02 NOTE — Telephone Encounter (Signed)
Rescheduled 02/18 appointment to 02/09 due to provider pal, patient has been called and voicemail was left.

## 2020-04-22 ENCOUNTER — Inpatient Hospital Stay: Payer: BC Managed Care – PPO | Admitting: Oncology

## 2020-04-22 ENCOUNTER — Inpatient Hospital Stay: Payer: BC Managed Care – PPO

## 2020-04-30 ENCOUNTER — Ambulatory Visit
Admission: RE | Admit: 2020-04-30 | Discharge: 2020-04-30 | Disposition: A | Discharge status: Home / Self Care | Payer: Self-pay | Source: Ambulatory Visit | Attending: Family Medicine | Admitting: Family Medicine

## 2020-04-30 ENCOUNTER — Other Ambulatory Visit: Payer: Self-pay

## 2020-04-30 ENCOUNTER — Ambulatory Visit (INDEPENDENT_AMBULATORY_CARE_PROVIDER_SITE_OTHER): Payer: Medicaid Other

## 2020-04-30 VITALS — BP 150/104 | HR 77 | Temp 98.2°F | Resp 20

## 2020-04-30 DIAGNOSIS — J189 Pneumonia, unspecified organism: Secondary | ICD-10-CM

## 2020-04-30 DIAGNOSIS — R0981 Nasal congestion: Secondary | ICD-10-CM

## 2020-04-30 DIAGNOSIS — R0602 Shortness of breath: Secondary | ICD-10-CM

## 2020-04-30 DIAGNOSIS — R059 Cough, unspecified: Secondary | ICD-10-CM

## 2020-04-30 DIAGNOSIS — R062 Wheezing: Secondary | ICD-10-CM

## 2020-04-30 DIAGNOSIS — Z1152 Encounter for screening for COVID-19: Secondary | ICD-10-CM

## 2020-04-30 MED ORDER — BENZONATATE 100 MG PO CAPS: ORAL_CAPSULE | ORAL | 0 refills | Status: DC

## 2020-04-30 MED ORDER — DOXYCYCLINE HYCLATE 100 MG PO CAPS: 100.0000 mg | ORAL_CAPSULE | Freq: Two times a day (BID) | ORAL | 0 refills | Status: DC

## 2020-04-30 NOTE — ED Triage Notes (Signed)
Patient states she has had a cough and nasal congestion x 9 days. Pt has mild body aches as well. Pt is aox4 and ambulatory.

## 2020-05-01 ENCOUNTER — Ambulatory Visit: Payer: BC Managed Care – PPO | Admitting: Oncology

## 2020-05-01 ENCOUNTER — Other Ambulatory Visit: Payer: BC Managed Care – PPO

## 2020-05-01 LAB — SARS-COV-2, NAA 2 DAY TAT

## 2020-05-01 LAB — NOVEL CORONAVIRUS, NAA: SARS-CoV-2, NAA: NOT DETECTED

## 2020-05-05 NOTE — ED Provider Notes (Signed)
[facility]   [id number] 04/30/20 Arrival Time: 1648  ASSESSMENT & PLAN:  1. Community acquired pneumonia of right lower lobe of lung   2. Encounter for screening for COVID-19   3. Cough   4. Wheezing      COVID-19 testing sent. See letter/work note on file for self-isolation guidelines. OTC symptom care as needed. See AVS for d/c information.  Begin: Meds ordered this encounter  Medications  . doxycycline (VIBRAMYCIN) 100 MG capsule    Sig: Take 1 capsule (100 mg total) by mouth 2 (two) times daily.    Dispense:  20 capsule    Refill:  0  . benzonatate (TESSALON) 100 MG capsule    Sig: Take 1 capsule by mouth every 8 (eight) hours for cough.    Dispense:  21 capsule    Refill:  0     Follow-up Information    [facility] .   Specialty: Urgent Care Why: If worsening or failing to improve as anticipated. Contact information: [phone] [address] [address] [phone]              Reviewed expectations re: course of current medical issues. Questions answered. Outlined signs and symptoms indicating need for more acute intervention. Understanding verbalized. After Visit Summary given.   SUBJECTIVE: History from: patient. Melody Gates is a 30 y.o. female who reports cough and nasal congestion; approx 1.5 w; body aches; cough worsening; prod at times. She is very fatigued. Unsure of fever; reports chills. Denies: difficulty breathing. Normal PO intake without n/v/d. No CP. No LE edema.  Social History   Tobacco Use  Smoking Status Former Smoker  Smokeless Tobacco Never Used     OBJECTIVE:  Vitals:   04/30/20 1708 04/30/20 1738  BP: (!) 150/104   Pulse: 77   Resp: 20   Temp: 98.2 F (36.8 C)   TempSrc: Oral   SpO2: 96% 96%    General appearance: alert; no distress Eyes: PERRLA; EOMI; conjunctiva normal HENT: [place]; AT; with nasal congestion Neck: supple   Lungs: speaks full sentences without difficulty; unlabored; coarse breath sounds bilaterally; no wheezing; active cough Extremities: no edema Skin: warm and dry Neurologic: normal gait Psychological: alert and cooperative; normal mood and affect  Labs: Results for orders placed or performed during the hospital encounter of 04/30/20  Novel Coronavirus, NAA ([facility])   Specimen: Nasopharyngeal Swab; Nasopharyngeal(NP) swabs in vial transport medium   Nasopharynge  Patient  Result Value Ref Range   SARS-CoV-2, NAA Not Detected Not Detected  SARS-COV-2, NAA 2 DAY TAT   Nasopharynge  Patient  Result Value Ref Range   SARS-CoV-2, NAA 2 DAY TAT Performed    Labs Reviewed  NOVEL CORONAVIRUS, NAA   Narrative:    Performed at:  [address] [employer] [address], [place], [place]  [id number] Lab Director: Jolene Schimke MD, Phone:  [phone]  SARS-COV-2, NAA 2 DAY TAT   Narrative:    Performed at:  [address] [facility] [address], [place], [place]  [id number] Lab Director: Jolene Schimke MD, Phone:  [phone]    Imaging: DG Chest 2 View  Result Date: 04/30/2020 CLINICAL DATA:  Wheezing, shortness of breath, cough, and nasal congestion for 9 days EXAM: CHEST - 2 VIEW COMPARISON:  12/15/2019 FINDINGS: Upper normal heart size. Mediastinal contours and pulmonary vascularity normal. Minimal peribronchial thickening with mild infiltrate at RIGHT lung base question pneumonia. Remaining lungs clear. No pleural effusion  or pneumothorax. IMPRESSION: Minimal bronchitic changes with mild RIGHT basilar infiltrate consistent with pneumonia. Electronically Signed   By: Ulyses Southward M.D.   On: 04/30/2020 18:20    Allergies  Allergen Reactions  . Penicillins Swelling  . Amoxicillin     Past Medical History:  Diagnosis Date  . Anemia   . Hypertension    Social History   Socioeconomic History  . Marital status: Single    Spouse name: Not on file  . Number of children: Not on file   . Years of education: Not on file  . Highest education level: Not on file  Occupational History  . Not on file  Tobacco Use  . Smoking status: Former Games developer  . Smokeless tobacco: Never Used  Vaping Use  . Vaping Use: Never used  Substance and Sexual Activity  . Alcohol use: Not Currently  . Drug use: Never  . Sexual activity: Yes  Other Topics Concern  . Not on file  Social History Narrative  . Not on file   Social Determinants of Health   Financial Resource Strain: Not on file  Food Insecurity: Not on file  Transportation Needs: Not on file  Physical Activity: Not on file  Stress: Not on file  Social Connections: Not on file  Intimate Partner Violence: Not on file   History reviewed. No pertinent family history. History reviewed. No pertinent surgical history.   Mardella Layman, MD 05/05/20 [phone]

## 2020-06-04 ENCOUNTER — Inpatient Hospital Stay: Payer: BC Managed Care – PPO | Attending: Oncology

## 2020-06-04 ENCOUNTER — Inpatient Hospital Stay (HOSPITAL_BASED_OUTPATIENT_CLINIC_OR_DEPARTMENT_OTHER): Payer: BC Managed Care – PPO | Admitting: Oncology

## 2020-06-04 ENCOUNTER — Other Ambulatory Visit: Payer: Self-pay

## 2020-06-04 VITALS — BP 147/96 | HR 89 | Temp 98.8°F | Resp 17 | Ht 66.0 in | Wt 299.5 lb

## 2020-06-04 DIAGNOSIS — D75838 Other thrombocytosis: Secondary | ICD-10-CM | POA: Insufficient documentation

## 2020-06-04 DIAGNOSIS — D500 Iron deficiency anemia secondary to blood loss (chronic): Secondary | ICD-10-CM | POA: Insufficient documentation

## 2020-06-04 DIAGNOSIS — D72829 Elevated white blood cell count, unspecified: Secondary | ICD-10-CM | POA: Insufficient documentation

## 2020-06-04 DIAGNOSIS — D509 Iron deficiency anemia, unspecified: Secondary | ICD-10-CM

## 2020-06-04 DIAGNOSIS — N920 Excessive and frequent menstruation with regular cycle: Secondary | ICD-10-CM | POA: Diagnosis present

## 2020-06-04 DIAGNOSIS — Z79899 Other long term (current) drug therapy: Secondary | ICD-10-CM | POA: Insufficient documentation

## 2020-06-04 LAB — CBC WITH DIFFERENTIAL (CANCER CENTER ONLY)
Abs Immature Granulocytes: 0.05 10*3/uL (ref 0.00–0.07)
Basophils Absolute: 0.1 10*3/uL (ref 0.0–0.1)
Basophils Relative: 1 %
Eosinophils Absolute: 0.6 10*3/uL — ABNORMAL HIGH (ref 0.0–0.5)
Eosinophils Relative: 5 %
HCT: 31.5 % — ABNORMAL LOW (ref 36.0–46.0)
Hemoglobin: 8.7 g/dL — ABNORMAL LOW (ref 12.0–15.0)
Immature Granulocytes: 0 %
Lymphocytes Relative: 23 %
Lymphs Abs: 2.9 10*3/uL (ref 0.7–4.0)
MCH: 17.6 pg — ABNORMAL LOW (ref 26.0–34.0)
MCHC: 27.6 g/dL — ABNORMAL LOW (ref 30.0–36.0)
MCV: 63.6 fL — ABNORMAL LOW (ref 80.0–100.0)
Monocytes Absolute: 0.5 10*3/uL (ref 0.1–1.0)
Monocytes Relative: 4 %
Neutro Abs: 8.6 10*3/uL — ABNORMAL HIGH (ref 1.7–7.7)
Neutrophils Relative %: 67 %
Platelet Count: 495 10*3/uL — ABNORMAL HIGH (ref 150–400)
RBC: 4.95 MIL/uL (ref 3.87–5.11)
RDW: 20.3 % — ABNORMAL HIGH (ref 11.5–15.5)
WBC Count: 12.8 10*3/uL — ABNORMAL HIGH (ref 4.0–10.5)
nRBC: 0 % (ref 0.0–0.2)

## 2020-06-04 LAB — IRON AND TIBC
Iron: 17 ug/dL — ABNORMAL LOW (ref 41–142)
Saturation Ratios: 5 % — ABNORMAL LOW (ref 21–57)
TIBC: 366 ug/dL (ref 236–444)
UIBC: 349 ug/dL (ref 120–384)

## 2020-06-04 LAB — FERRITIN: Ferritin: 9 ng/mL — ABNORMAL LOW (ref 11–307)

## 2020-06-04 NOTE — Progress Notes (Signed)
Hematology and Oncology Follow Up Visit  Melody Gates [id number] 1990-10-30 29 y.o. 06/04/2020 9:08 AM Patient, No Pcp Michaelene Song, MD   Principle Diagnosis: 30 year old woman with iron deficiency anemia presented with hemoglobin of 7.9, iron of 16 ferritin of 16 in October 2021.  Her iron deficiency related to chronic menstrual blood losses.   Prior Therapy:  Ferric gluconate given between in February 07, 2020 till February 20, 2020.  Current therapy: Active surveillance.  Interim History: Ms. Giel returns today for a follow-up visit.  Since the last visit, she received intravenous iron infusion without any complications.  She has been on oral iron replacement in the past and has been ineffective.  She denies any nausea, vomiting or hematochezia.  She denies any melena or weight loss.  She continues to have irregular menstrual cycles.     Medications: I have reviewed the patient's current medications.  Current Outpatient Medications  Medication Sig Dispense Refill  . benzonatate (TESSALON) 100 MG capsule Take 1 capsule by mouth every 8 (eight) hours for cough. 21 capsule 0  . cholecalciferol (VITAMIN D3) 25 MCG (1000 UNIT) tablet Take 1,000 Units by mouth daily.    Marland Kitchen doxycycline (VIBRAMYCIN) 100 MG capsule Take 1 capsule (100 mg total) by mouth 2 (two) times daily. 20 capsule 0  . Ferrous Gluconate (IRON 27 PO) Take 1 tablet by mouth daily.    . ferrous sulfate 325 (65 FE) MG tablet Take 1 tablet (325 mg total) by mouth daily. 30 tablet 0  . hydrochlorothiazide (HYDRODIURIL) 25 MG tablet Take 25 mg by mouth daily.    Marland Kitchen losartan (COZAAR) 100 MG tablet Take 100 mg by mouth daily.    . norgestimate-ethinyl estradiol (ORTHO-CYCLEN) 0.25-35 MG-MCG tablet Take 2 tablets by mouth daily for 7 days, THEN 1 tablet daily for 7 days. 28 tablet 0  . SLYND 4 MG TABS Take 1 tablet by mouth daily.     No current facility-administered medications for this visit.     Allergies:   Allergies  Allergen Reactions  . Penicillins Swelling  . Amoxicillin       Physical Exam: Blood pressure (!) 147/96, pulse 89, temperature 98.8 F (37.1 C), temperature source Tympanic, resp. rate 17, height 5\' 6"  (1.676 m), weight 299 lb 8 oz (135.9 kg), SpO2 98 %.   ECOG: 0    General appearance: Comfortable appearing without any discomfort Head: Normocephalic without any trauma Oropharynx: Mucous membranes are moist and pink without any thrush or ulcers. Eyes: Pupils are equal and round reactive to light. Lymph nodes: No cervical, supraclavicular, inguinal or axillary lymphadenopathy.   Heart:regular rate and rhythm.  S1 and S2 without leg edema. Lung: Clear without any rhonchi or wheezes.  No dullness to percussion. Abdomin: Soft, nontender, nondistended with good bowel sounds.  No hepatosplenomegaly. Musculoskeletal: No joint deformity or effusion.  Full range of motion noted. Neurological: No deficits noted on motor, sensory and deep tendon reflex exam. Skin: No petechial rash or dryness.  Appeared moist.      Lab Results: Lab Results  Component Value Date   WBC 12.5 (H) 01/29/2020   HGB 6.8 (LL) 01/29/2020   HCT 23.7 (L) 01/29/2020   MCV 62.7 (L) 01/29/2020   PLT 538 (H) 01/29/2020     Chemistry      Component Value Date/Time   NA 133 (L) 12/15/2019 1657   K 3.7 12/15/2019 1657   CL 101 12/15/2019 1657   CO2 23 12/15/2019 1657   BUN  14 12/15/2019 1657   CREATININE 0.79 12/15/2019 1657      Component Value Date/Time   CALCIUM 9.5 12/15/2019 1657   ALKPHOS 45 12/15/2019 1657   AST 12 (L) 12/15/2019 1657   ALT 11 12/15/2019 1657   BILITOT 0.4 12/15/2019 1657        Impression and Plan:   30 year old woman with:  1.    Anemia related to iron deficiency and chronic menstrual blood losses diagnosed in October 2021.    She is received intravenous iron infusion without any complications completed in December 2021.  Risks and benefits of repeat  intravenous iron in the future were discussed.  Potential complications including arthralgias, myalgias and infusion related issues were reiterated.  Laboratory data reviewed today and showed improvement in her hemoglobin but likely still deficient in iron.  I recommended to repeat intravenous iron infusion in the near future.  She is agreeable to the.   2.  Leukocytosis: Reactive in nature and remains overall mild.  We will continue to monitor on future visits.   3.  Thrombocytosis: Reactive in nature related to iron deficiency and I anticipate improvement with correction of intravenous iron.  Her platelet count today continues to improve with correction of iron levels.  4.  Menorrhagia: She has follow-up with gynecology in the future regarding this issue.  5.  Follow-up: In 4 months for repeat follow-up.  30  minutes were spent on this encounter.  The time was dedicated to reviewing laboratory data, discussing her disease status, treatment options and complications related to her disease and treatment.    Eli Hose, MD 3/24/20229:08 AM

## 2020-06-09 ENCOUNTER — Telehealth: Payer: Self-pay | Admitting: Oncology

## 2020-06-09 NOTE — Telephone Encounter (Signed)
Scheduled per 03/24 los, patient has been called and notified of all upcoming appointments.

## 2020-06-12 ENCOUNTER — Other Ambulatory Visit: Payer: Self-pay

## 2020-06-12 ENCOUNTER — Inpatient Hospital Stay: Payer: BC Managed Care – PPO | Attending: Oncology

## 2020-06-12 VITALS — BP 135/77 | HR 71 | Temp 99.2°F | Resp 17

## 2020-06-12 DIAGNOSIS — N920 Excessive and frequent menstruation with regular cycle: Secondary | ICD-10-CM | POA: Diagnosis present

## 2020-06-12 DIAGNOSIS — D509 Iron deficiency anemia, unspecified: Secondary | ICD-10-CM

## 2020-06-12 DIAGNOSIS — D500 Iron deficiency anemia secondary to blood loss (chronic): Secondary | ICD-10-CM | POA: Insufficient documentation

## 2020-06-12 MED ORDER — SODIUM CHLORIDE 0.9 % IV SOLN
Freq: Once | INTRAVENOUS | Status: AC
  Filled 2020-06-12: qty 250

## 2020-06-12 MED ORDER — SODIUM CHLORIDE 0.9 % IV SOLN
125.0000 mg | Freq: Once | INTRAVENOUS | Status: AC
  Administered 2020-06-12: 125 mg via INTRAVENOUS
  Filled 2020-06-12: qty 10

## 2020-06-12 NOTE — Patient Instructions (Signed)
Sodium Ferric Gluconate Complex injection What is this medicine? SODIUM FERRIC GLUCONATE COMPLEX (SOE dee um FER ik GLOO koe nate KOM pleks) is an iron replacement. It is used with epoetin therapy to treat low iron levels in patients who are receiving hemodialysis. This medicine may be used for other purposes; ask your health care provider or pharmacist if you have questions. COMMON BRAND NAME(S): Ferrlecit, Nulecit What should I tell my health care provider before I take this medicine? They need to know if you have any of the following conditions:  anemia that is not from iron deficiency  high levels of iron in the body  an unusual or allergic reaction to iron, benzyl alcohol, other medicines, foods, dyes, or preservatives  pregnant or are trying to become pregnant  breast-feeding How should I use this medicine? This medicine is for infusion into a vein. It is given by a health care professional in a hospital or clinic setting. Talk to your pediatrician regarding the use of this medicine in children. While this drug may be prescribed for children as young as 6 years old for selected conditions, precautions do apply. Overdosage: If you think you have taken too much of this medicine contact a poison control center or emergency room at once. NOTE: This medicine is only for you. Do not share this medicine with others. What if I miss a dose? It is important not to miss your dose. Call your doctor or health care professional if you are unable to keep an appointment. What may interact with this medicine? Do not take this medicine with any of the following medications:  deferoxamine  dimercaprol  other iron products This medicine may also interact with the following medications:  chloramphenicol  deferasirox  medicine for blood pressure like enalapril This list may not describe all possible interactions. Give your health care provider a list of all the medicines, herbs,  non-prescription drugs, or dietary supplements you use. Also tell them if you smoke, drink alcohol, or use illegal drugs. Some items may interact with your medicine. What should I watch for while using this medicine? Your condition will be monitored carefully while you are receiving this medicine. Visit your doctor for check-ups as directed. What side effects may I notice from receiving this medicine? Side effects that you should report to your doctor or health care professional as soon as possible:  allergic reactions like skin rash, itching or hives, swelling of the face, lips, or tongue  breathing problems  changes in hearing  changes in vision  chills, flushing, or sweating  fast, irregular heartbeat  feeling faint or lightheaded, falls  fever, flu-like symptoms  high or low blood pressure  pain, tingling, numbness in the hands or feet  severe pain in the chest, back, flanks, or groin  swelling of the ankles, feet, hands  trouble passing urine or change in the amount of urine  unusually weak or tired Side effects that usually do not require medical attention (report to your doctor or health care professional if they continue or are bothersome):  cramps  dark colored stools  diarrhea  headache  nausea, vomiting  stomach upset This list may not describe all possible side effects. Call your doctor for medical advice about side effects. You may report side effects to FDA at 1-800-FDA-1088. Where should I keep my medicine? This drug is given in a hospital or clinic and will not be stored at home. NOTE: This sheet is a summary. It may not cover all   possible information. If you have questions about this medicine, talk to your doctor, pharmacist, or health care provider.  2021 Elsevier/Gold Standard (2007-10-31 15:58:57)   

## 2020-06-19 ENCOUNTER — Other Ambulatory Visit: Payer: Self-pay

## 2020-06-19 ENCOUNTER — Inpatient Hospital Stay: Payer: BC Managed Care – PPO

## 2020-06-19 VITALS — BP 134/74 | HR 78 | Temp 98.3°F | Resp 18

## 2020-06-19 DIAGNOSIS — D500 Iron deficiency anemia secondary to blood loss (chronic): Secondary | ICD-10-CM | POA: Diagnosis not present

## 2020-06-19 DIAGNOSIS — D509 Iron deficiency anemia, unspecified: Secondary | ICD-10-CM

## 2020-06-19 MED ORDER — SODIUM CHLORIDE 0.9 % IV SOLN
Freq: Once | INTRAVENOUS | Status: AC
  Filled 2020-06-19: qty 250

## 2020-06-19 MED ORDER — NA FERRIC GLUC CPLX IN SUCROSE 12.5 MG/ML IV SOLN
125.0000 mg | Freq: Once | INTRAVENOUS | Status: AC
  Administered 2020-06-19: 125 mg via INTRAVENOUS
  Filled 2020-06-19: qty 10

## 2020-06-19 NOTE — Patient Instructions (Signed)
Sodium Ferric Gluconate Complex injection What is this medicine? SODIUM FERRIC GLUCONATE COMPLEX (SOE dee um FER ik GLOO koe nate KOM pleks) is an iron replacement. It is used with epoetin therapy to treat low iron levels in patients who are receiving hemodialysis. This medicine may be used for other purposes; ask your health care provider or pharmacist if you have questions. COMMON BRAND NAME(S): Ferrlecit, Nulecit What should I tell my health care provider before I take this medicine? They need to know if you have any of the following conditions:  anemia that is not from iron deficiency  high levels of iron in the body  an unusual or allergic reaction to iron, benzyl alcohol, other medicines, foods, dyes, or preservatives  pregnant or are trying to become pregnant  breast-feeding How should I use this medicine? This medicine is for infusion into a vein. It is given by a health care professional in a hospital or clinic setting. Talk to your pediatrician regarding the use of this medicine in children. While this drug may be prescribed for children as young as 6 years old for selected conditions, precautions do apply. Overdosage: If you think you have taken too much of this medicine contact a poison control center or emergency room at once. NOTE: This medicine is only for you. Do not share this medicine with others. What if I miss a dose? It is important not to miss your dose. Call your doctor or health care professional if you are unable to keep an appointment. What may interact with this medicine? Do not take this medicine with any of the following medications:  deferoxamine  dimercaprol  other iron products This medicine may also interact with the following medications:  chloramphenicol  deferasirox  medicine for blood pressure like enalapril This list may not describe all possible interactions. Give your health care provider a list of all the medicines, herbs,  non-prescription drugs, or dietary supplements you use. Also tell them if you smoke, drink alcohol, or use illegal drugs. Some items may interact with your medicine. What should I watch for while using this medicine? Your condition will be monitored carefully while you are receiving this medicine. Visit your doctor for check-ups as directed. What side effects may I notice from receiving this medicine? Side effects that you should report to your doctor or health care professional as soon as possible:  allergic reactions like skin rash, itching or hives, swelling of the face, lips, or tongue  breathing problems  changes in hearing  changes in vision  chills, flushing, or sweating  fast, irregular heartbeat  feeling faint or lightheaded, falls  fever, flu-like symptoms  high or low blood pressure  pain, tingling, numbness in the hands or feet  severe pain in the chest, back, flanks, or groin  swelling of the ankles, feet, hands  trouble passing urine or change in the amount of urine  unusually weak or tired Side effects that usually do not require medical attention (report to your doctor or health care professional if they continue or are bothersome):  cramps  dark colored stools  diarrhea  headache  nausea, vomiting  stomach upset This list may not describe all possible side effects. Call your doctor for medical advice about side effects. You may report side effects to FDA at 1-800-FDA-1088. Where should I keep my medicine? This drug is given in a hospital or clinic and will not be stored at home. NOTE: This sheet is a summary. It may not cover all   possible information. If you have questions about this medicine, talk to your doctor, pharmacist, or health care provider.  2021 Elsevier/Gold Standard (2007-10-31 15:58:57)   

## 2020-06-26 ENCOUNTER — Other Ambulatory Visit: Payer: Self-pay

## 2020-06-26 ENCOUNTER — Inpatient Hospital Stay: Payer: BC Managed Care – PPO

## 2020-06-26 VITALS — BP 138/67 | HR 67 | Temp 98.7°F | Resp 18

## 2020-06-26 DIAGNOSIS — D509 Iron deficiency anemia, unspecified: Secondary | ICD-10-CM

## 2020-06-26 DIAGNOSIS — D500 Iron deficiency anemia secondary to blood loss (chronic): Secondary | ICD-10-CM | POA: Diagnosis not present

## 2020-06-26 MED ORDER — SODIUM CHLORIDE 0.9 % IV SOLN
125.0000 mg | Freq: Once | INTRAVENOUS | Status: AC
  Administered 2020-06-26: 125 mg via INTRAVENOUS
  Filled 2020-06-26: qty 10

## 2020-06-26 MED ORDER — SODIUM CHLORIDE 0.9 % IV SOLN
Freq: Once | INTRAVENOUS | Status: AC
  Filled 2020-06-26: qty 250

## 2020-06-26 NOTE — Progress Notes (Signed)
Patient declined to stay for 30 minutes post observation period. VSS upon leaving unit.  

## 2020-06-26 NOTE — Patient Instructions (Signed)
Sodium Ferric Gluconate Complex injection What is this medicine? SODIUM FERRIC GLUCONATE COMPLEX (SOE dee um FER ik GLOO koe nate KOM pleks) is an iron replacement. It is used with epoetin therapy to treat low iron levels in patients who are receiving hemodialysis. This medicine may be used for other purposes; ask your health care provider or pharmacist if you have questions. COMMON BRAND NAME(S): Ferrlecit, Nulecit What should I tell my health care provider before I take this medicine? They need to know if you have any of the following conditions:  anemia that is not from iron deficiency  high levels of iron in the body  an unusual or allergic reaction to iron, benzyl alcohol, other medicines, foods, dyes, or preservatives  pregnant or are trying to become pregnant  breast-feeding How should I use this medicine? This medicine is for infusion into a vein. It is given by a health care professional in a hospital or clinic setting. Talk to your pediatrician regarding the use of this medicine in children. While this drug may be prescribed for children as young as 6 years old for selected conditions, precautions do apply. Overdosage: If you think you have taken too much of this medicine contact a poison control center or emergency room at once. NOTE: This medicine is only for you. Do not share this medicine with others. What if I miss a dose? It is important not to miss your dose. Call your doctor or health care professional if you are unable to keep an appointment. What may interact with this medicine? Do not take this medicine with any of the following medications:  deferoxamine  dimercaprol  other iron products This medicine may also interact with the following medications:  chloramphenicol  deferasirox  medicine for blood pressure like enalapril This list may not describe all possible interactions. Give your health care provider a list of all the medicines, herbs,  non-prescription drugs, or dietary supplements you use. Also tell them if you smoke, drink alcohol, or use illegal drugs. Some items may interact with your medicine. What should I watch for while using this medicine? Your condition will be monitored carefully while you are receiving this medicine. Visit your doctor for check-ups as directed. What side effects may I notice from receiving this medicine? Side effects that you should report to your doctor or health care professional as soon as possible:  allergic reactions like skin rash, itching or hives, swelling of the face, lips, or tongue  breathing problems  changes in hearing  changes in vision  chills, flushing, or sweating  fast, irregular heartbeat  feeling faint or lightheaded, falls  fever, flu-like symptoms  high or low blood pressure  pain, tingling, numbness in the hands or feet  severe pain in the chest, back, flanks, or groin  swelling of the ankles, feet, hands  trouble passing urine or change in the amount of urine  unusually weak or tired Side effects that usually do not require medical attention (report to your doctor or health care professional if they continue or are bothersome):  cramps  dark colored stools  diarrhea  headache  nausea, vomiting  stomach upset This list may not describe all possible side effects. Call your doctor for medical advice about side effects. You may report side effects to FDA at 1-800-FDA-1088. Where should I keep my medicine? This drug is given in a hospital or clinic and will not be stored at home. NOTE: This sheet is a summary. It may not cover all   possible information. If you have questions about this medicine, talk to your doctor, pharmacist, or health care provider.  2021 Elsevier/Gold Standard (2007-10-31 15:58:57)   

## 2020-07-03 ENCOUNTER — Other Ambulatory Visit: Payer: Self-pay

## 2020-07-03 ENCOUNTER — Inpatient Hospital Stay: Payer: BC Managed Care – PPO

## 2020-07-03 VITALS — BP 136/82 | HR 79 | Temp 98.0°F | Resp 18

## 2020-07-03 DIAGNOSIS — D509 Iron deficiency anemia, unspecified: Secondary | ICD-10-CM

## 2020-07-03 DIAGNOSIS — D500 Iron deficiency anemia secondary to blood loss (chronic): Secondary | ICD-10-CM | POA: Diagnosis not present

## 2020-07-03 MED ORDER — SODIUM CHLORIDE 0.9 % IV SOLN
125.0000 mg | Freq: Once | INTRAVENOUS | Status: AC
  Administered 2020-07-03: 125 mg via INTRAVENOUS
  Filled 2020-07-03: qty 10

## 2020-07-03 MED ORDER — SODIUM CHLORIDE 0.9 % IV SOLN
Freq: Once | INTRAVENOUS | Status: AC
  Filled 2020-07-03: qty 250

## 2020-07-03 NOTE — Progress Notes (Signed)
Patient declined to stay for 30 minute observation. Vitals stable and patient in no distress upon leaving infusion room.  °

## 2020-07-03 NOTE — Patient Instructions (Signed)
Sodium Ferric Gluconate Complex injection What is this medicine? SODIUM FERRIC GLUCONATE COMPLEX (SOE dee um FER ik GLOO koe nate KOM pleks) is an iron replacement. It is used with epoetin therapy to treat low iron levels in patients who are receiving hemodialysis. This medicine may be used for other purposes; ask your health care provider or pharmacist if you have questions. COMMON BRAND NAME(S): Ferrlecit, Nulecit What should I tell my health care provider before I take this medicine? They need to know if you have any of the following conditions:  anemia that is not from iron deficiency  high levels of iron in the body  an unusual or allergic reaction to iron, benzyl alcohol, other medicines, foods, dyes, or preservatives  pregnant or are trying to become pregnant  breast-feeding How should I use this medicine? This medicine is for infusion into a vein. It is given by a health care professional in a hospital or clinic setting. Talk to your pediatrician regarding the use of this medicine in children. While this drug may be prescribed for children as young as 6 years old for selected conditions, precautions do apply. Overdosage: If you think you have taken too much of this medicine contact a poison control center or emergency room at once. NOTE: This medicine is only for you. Do not share this medicine with others. What if I miss a dose? It is important not to miss your dose. Call your doctor or health care professional if you are unable to keep an appointment. What may interact with this medicine? Do not take this medicine with any of the following medications:  deferoxamine  dimercaprol  other iron products This medicine may also interact with the following medications:  chloramphenicol  deferasirox  medicine for blood pressure like enalapril This list may not describe all possible interactions. Give your health care provider a list of all the medicines, herbs,  non-prescription drugs, or dietary supplements you use. Also tell them if you smoke, drink alcohol, or use illegal drugs. Some items may interact with your medicine. What should I watch for while using this medicine? Your condition will be monitored carefully while you are receiving this medicine. Visit your doctor for check-ups as directed. What side effects may I notice from receiving this medicine? Side effects that you should report to your doctor or health care professional as soon as possible:  allergic reactions like skin rash, itching or hives, swelling of the face, lips, or tongue  breathing problems  changes in hearing  changes in vision  chills, flushing, or sweating  fast, irregular heartbeat  feeling faint or lightheaded, falls  fever, flu-like symptoms  high or low blood pressure  pain, tingling, numbness in the hands or feet  severe pain in the chest, back, flanks, or groin  swelling of the ankles, feet, hands  trouble passing urine or change in the amount of urine  unusually weak or tired Side effects that usually do not require medical attention (report to your doctor or health care professional if they continue or are bothersome):  cramps  dark colored stools  diarrhea  headache  nausea, vomiting  stomach upset This list may not describe all possible side effects. Call your doctor for medical advice about side effects. You may report side effects to FDA at 1-800-FDA-1088. Where should I keep my medicine? This drug is given in a hospital or clinic and will not be stored at home. NOTE: This sheet is a summary. It may not cover all   possible information. If you have questions about this medicine, talk to your doctor, pharmacist, or health care provider.  2021 Elsevier/Gold Standard (2007-10-31 15:58:57)   

## 2020-07-15 LAB — HM PAP SMEAR
HM Pap smear: ABNORMAL
HPV, high-risk: POSITIVE

## 2020-10-02 ENCOUNTER — Inpatient Hospital Stay (HOSPITAL_BASED_OUTPATIENT_CLINIC_OR_DEPARTMENT_OTHER): Payer: BC Managed Care – PPO | Admitting: Oncology

## 2020-10-02 ENCOUNTER — Other Ambulatory Visit: Payer: Self-pay

## 2020-10-02 ENCOUNTER — Inpatient Hospital Stay: Payer: BC Managed Care – PPO | Attending: Oncology

## 2020-10-02 VITALS — BP 161/86 | HR 84 | Temp 99.5°F | Resp 13 | Ht 66.0 in | Wt 286.5 lb

## 2020-10-02 DIAGNOSIS — D509 Iron deficiency anemia, unspecified: Secondary | ICD-10-CM | POA: Diagnosis not present

## 2020-10-02 DIAGNOSIS — N920 Excessive and frequent menstruation with regular cycle: Secondary | ICD-10-CM | POA: Insufficient documentation

## 2020-10-02 DIAGNOSIS — D75839 Thrombocytosis, unspecified: Secondary | ICD-10-CM | POA: Insufficient documentation

## 2020-10-02 DIAGNOSIS — D72829 Elevated white blood cell count, unspecified: Secondary | ICD-10-CM | POA: Insufficient documentation

## 2020-10-02 DIAGNOSIS — D500 Iron deficiency anemia secondary to blood loss (chronic): Secondary | ICD-10-CM | POA: Diagnosis present

## 2020-10-02 LAB — CBC WITH DIFFERENTIAL (CANCER CENTER ONLY)
Abs Immature Granulocytes: 0.04 10*3/uL (ref 0.00–0.07)
Basophils Absolute: 0.1 10*3/uL (ref 0.0–0.1)
Basophils Relative: 1 %
Eosinophils Absolute: 0.5 10*3/uL (ref 0.0–0.5)
Eosinophils Relative: 4 %
HCT: 37.6 % (ref 36.0–46.0)
Hemoglobin: 11.4 g/dL — ABNORMAL LOW (ref 12.0–15.0)
Immature Granulocytes: 0 %
Lymphocytes Relative: 19 %
Lymphs Abs: 2.6 10*3/uL (ref 0.7–4.0)
MCH: 21.7 pg — ABNORMAL LOW (ref 26.0–34.0)
MCHC: 30.3 g/dL (ref 30.0–36.0)
MCV: 71.5 fL — ABNORMAL LOW (ref 80.0–100.0)
Monocytes Absolute: 0.6 10*3/uL (ref 0.1–1.0)
Monocytes Relative: 5 %
Neutro Abs: 10.3 10*3/uL — ABNORMAL HIGH (ref 1.7–7.7)
Neutrophils Relative %: 71 %
Platelet Count: 374 10*3/uL (ref 150–400)
RBC: 5.26 MIL/uL — ABNORMAL HIGH (ref 3.87–5.11)
RDW: 19.9 % — ABNORMAL HIGH (ref 11.5–15.5)
WBC Count: 14.2 10*3/uL — ABNORMAL HIGH (ref 4.0–10.5)
nRBC: 0 % (ref 0.0–0.2)

## 2020-10-02 LAB — FERRITIN: Ferritin: 17 ng/mL (ref 11–307)

## 2020-10-02 LAB — IRON AND TIBC
Iron: 23 ug/dL — ABNORMAL LOW (ref 28–170)
Saturation Ratios: 6 % — ABNORMAL LOW (ref 10.4–31.8)
TIBC: 369 ug/dL (ref 250–450)
UIBC: 346 ug/dL

## 2020-10-02 NOTE — Progress Notes (Signed)
Hematology and Oncology Follow Up Visit  Melody Gates [id number] 1990/09/24 30 y.o. 10/02/2020 8:56 AM Patient, No Pcp Per (Inactive)Anissa Abbs, Blenda Nicely, MD   Principle Diagnosis: 30 year old woman with iron deficiency anemia related to chronic menstrual blood losses noted in October 2021.  She was found to have iron of 16 ferritin of 16.   Prior Therapy:  Ferric gluconate given between in February 07, 2020 till February 20, 2020. Ferric gluconate was given again in April 2022.  Current therapy: Repeat intravenous iron infusion as needed.  Interim History: Ms. Matura presents today for a follow-up evaluation.  Since her last visit, she received intravenous iron infusion in April 2022.  Since that time, she reports continuous improvement in her performance status and quality of life.  She continues to do well have less fatigue and improvement in her exercise tolerance.  She is currently on Provera which helped regulate her cycle without heavy menstrual bleeding.  She still has regular menstrual cycles monthly that lasts close to 6 days.  She has been intolerant to oral iron which also was ineffective.     Medications: Updated on review. Current Outpatient Medications  Medication Sig Dispense Refill   benzonatate (TESSALON) 100 MG capsule Take 1 capsule by mouth every 8 (eight) hours for cough. 21 capsule 0   cholecalciferol (VITAMIN D3) 25 MCG (1000 UNIT) tablet Take 1,000 Units by mouth daily.     doxycycline (VIBRAMYCIN) 100 MG capsule Take 1 capsule (100 mg total) by mouth 2 (two) times daily. 20 capsule 0   hydrochlorothiazide (HYDRODIURIL) 25 MG tablet Take 25 mg by mouth daily.     losartan (COZAAR) 100 MG tablet Take 100 mg by mouth daily.     norgestimate-ethinyl estradiol (ORTHO-CYCLEN) 0.25-35 MG-MCG tablet Take 2 tablets by mouth daily for 7 days, THEN 1 tablet daily for 7 days. 28 tablet 0   SLYND 4 MG TABS Take 1 tablet by mouth daily.     No current facility-administered  medications for this visit.     Allergies:  Allergies  Allergen Reactions   Penicillins Swelling   Amoxicillin       Physical Exam:  Blood pressure (!) 161/86, pulse 84, temperature 99.5 F (37.5 C), temperature source Oral, resp. rate 13, height 5\' 6"  (1.676 m), weight 286 lb 8 oz (130 kg), SpO2 100 %.   ECOG: 0   General appearance: Alert, awake without any distress. Head: Atraumatic without abnormalities Oropharynx: Without any thrush or ulcers. Eyes: No scleral icterus. Lymph nodes: No lymphadenopathy noted in the cervical, supraclavicular, or axillary nodes Heart:regular rate and rhythm, without any murmurs or gallops.   Lung: Clear to auscultation without any rhonchi, wheezes or dullness to percussion. Abdomin: Soft, nontender without any shifting dullness or ascites. Musculoskeletal: No clubbing or cyanosis. Neurological: No motor or sensory deficits. Skin: No rashes or lesions.      Lab Results: Lab Results  Component Value Date   WBC 12.8 (H) 06/04/2020   HGB 8.7 (L) 06/04/2020   HCT 31.5 (L) 06/04/2020   MCV 63.6 (L) 06/04/2020   PLT 495 (H) 06/04/2020     Chemistry      Component Value Date/Time   NA 133 (L) 12/15/2019 1657   K 3.7 12/15/2019 1657   CL 101 12/15/2019 1657   CO2 23 12/15/2019 1657   BUN 14 12/15/2019 1657   CREATININE 0.79 12/15/2019 1657      Component Value Date/Time   CALCIUM 9.5 12/15/2019 1657   ALKPHOS 45  12/15/2019 1657   AST 12 (L) 12/15/2019 1657   ALT 11 12/15/2019 1657   BILITOT 0.4 12/15/2019 1657        Impression and Plan:   30 year old woman with:   1.    Iron deficiency anemia related to menstrual blood losses diagnosed in October 2021.     Her disease status was updated at this time and laboratory data were reviewed.  She continues to receive intravenous iron infusion to replace her deficit.  Risks and benefits of repeat IV iron infusion were discussed.  Potential IV iron formulation were discussed  including iron sucrose as potential formulary replacement with higher doses to achieve iron replacement goals.  Complications that include arthralgias, myalgias and infusion related reaction were discussed.  After discussion today, we opted to proceed with repeat ferric gluconate given her reasonable response.  Her hemoglobin has improved although not normalized.     2.  Leukocytosis: Reactive related to iron deficiency and other factors.  Continues to improve with iron replacement.     3.  Thrombocytosis: Improved at this time with adequate replacement of iron.  Platelet count has normalized at this time.   4.  Menorrhagia: Improved with hormone therapy.   5.  Follow-up: She will return in the next 4 months for repeat follow-up.   30  minutes were dedicated to this encounter.  Time was spent on reviewing laboratory data, disease status update, treatment choices and complications related to therapy.     Eli Hose, MD 7/22/20228:56 AM

## 2020-10-08 ENCOUNTER — Telehealth: Payer: Self-pay | Admitting: Oncology

## 2020-10-08 NOTE — Telephone Encounter (Signed)
Sch per 7/22 los, left message

## 2020-10-09 ENCOUNTER — Ambulatory Visit: Payer: BC Managed Care – PPO

## 2020-10-16 ENCOUNTER — Other Ambulatory Visit: Payer: Self-pay

## 2020-10-16 ENCOUNTER — Inpatient Hospital Stay: Payer: BC Managed Care – PPO | Attending: Oncology

## 2020-10-16 VITALS — BP 131/75 | HR 72 | Temp 99.5°F | Resp 17

## 2020-10-16 DIAGNOSIS — N920 Excessive and frequent menstruation with regular cycle: Secondary | ICD-10-CM | POA: Insufficient documentation

## 2020-10-16 DIAGNOSIS — D500 Iron deficiency anemia secondary to blood loss (chronic): Secondary | ICD-10-CM | POA: Diagnosis present

## 2020-10-16 DIAGNOSIS — D509 Iron deficiency anemia, unspecified: Secondary | ICD-10-CM

## 2020-10-16 MED ORDER — SODIUM CHLORIDE 0.9 % IV SOLN
Freq: Once | INTRAVENOUS | Status: AC
  Filled 2020-10-16: qty 250

## 2020-10-16 MED ORDER — SODIUM CHLORIDE 0.9 % IV SOLN
125.0000 mg | Freq: Once | INTRAVENOUS | Status: AC
  Administered 2020-10-16: 125 mg via INTRAVENOUS
  Filled 2020-10-16: qty 125

## 2020-10-16 NOTE — Patient Instructions (Signed)
Sodium Ferric Gluconate Complex injection What is this medicine? SODIUM FERRIC GLUCONATE COMPLEX (SOE dee um FER ik GLOO koe nate KOM pleks) is an iron replacement. It is used with epoetin therapy to treat low iron levels in patients who are receiving hemodialysis. This medicine may be used for other purposes; ask your health care provider or pharmacist if you have questions. COMMON BRAND NAME(S): Ferrlecit, Nulecit What should I tell my health care provider before I take this medicine? They need to know if you have any of the following conditions:  anemia that is not from iron deficiency  high levels of iron in the body  an unusual or allergic reaction to iron, benzyl alcohol, other medicines, foods, dyes, or preservatives  pregnant or are trying to become pregnant  breast-feeding How should I use this medicine? This medicine is for infusion into a vein. It is given by a health care professional in a hospital or clinic setting. Talk to your pediatrician regarding the use of this medicine in children. While this drug may be prescribed for children as young as 6 years old for selected conditions, precautions do apply. Overdosage: If you think you have taken too much of this medicine contact a poison control center or emergency room at once. NOTE: This medicine is only for you. Do not share this medicine with others. What if I miss a dose? It is important not to miss your dose. Call your doctor or health care professional if you are unable to keep an appointment. What may interact with this medicine? Do not take this medicine with any of the following medications:  deferoxamine  dimercaprol  other iron products This medicine may also interact with the following medications:  chloramphenicol  deferasirox  medicine for blood pressure like enalapril This list may not describe all possible interactions. Give your health care provider a list of all the medicines, herbs,  non-prescription drugs, or dietary supplements you use. Also tell them if you smoke, drink alcohol, or use illegal drugs. Some items may interact with your medicine. What should I watch for while using this medicine? Your condition will be monitored carefully while you are receiving this medicine. Visit your doctor for check-ups as directed. What side effects may I notice from receiving this medicine? Side effects that you should report to your doctor or health care professional as soon as possible:  allergic reactions like skin rash, itching or hives, swelling of the face, lips, or tongue  breathing problems  changes in hearing  changes in vision  chills, flushing, or sweating  fast, irregular heartbeat  feeling faint or lightheaded, falls  fever, flu-like symptoms  high or low blood pressure  pain, tingling, numbness in the hands or feet  severe pain in the chest, back, flanks, or groin  swelling of the ankles, feet, hands  trouble passing urine or change in the amount of urine  unusually weak or tired Side effects that usually do not require medical attention (report to your doctor or health care professional if they continue or are bothersome):  cramps  dark colored stools  diarrhea  headache  nausea, vomiting  stomach upset This list may not describe all possible side effects. Call your doctor for medical advice about side effects. You may report side effects to FDA at 1-800-FDA-1088. Where should I keep my medicine? This drug is given in a hospital or clinic and will not be stored at home. NOTE: This sheet is a summary. It may not cover all   possible information. If you have questions about this medicine, talk to your doctor, pharmacist, or health care provider.  2021 Elsevier/Gold Standard (2007-10-31 15:58:57)   

## 2020-10-23 ENCOUNTER — Inpatient Hospital Stay: Payer: BC Managed Care – PPO

## 2020-10-30 ENCOUNTER — Inpatient Hospital Stay: Payer: BC Managed Care – PPO

## 2020-10-30 ENCOUNTER — Telehealth: Payer: Self-pay | Admitting: *Deleted

## 2020-10-30 ENCOUNTER — Telehealth: Payer: Self-pay

## 2020-10-30 NOTE — Telephone Encounter (Signed)
Patient left a voice mail that she wanted to cancel all her appointments and wanted Dr. Alver Fisher nurse to call her to see if it is ok. She mentioned that she will be out of town for a while. Relayed message to [place] @ [facility]

## 2020-10-30 NOTE — Telephone Encounter (Signed)
I tried to call the patient twice in hopes that I could get in touch with her. I left her a voicemail requesting a call back to inform her that Dr. Clelia Croft approved for the IV iron appointments to be cancelled on 8/19 and 8/26. We are leaving her follow up appointment in November as scheduled.

## 2020-10-30 NOTE — Telephone Encounter (Signed)
-----   Message from Benjiman Core, MD sent at 10/30/2020  1:09 PM EDT ----- Yes. Thanks ----- Message ----- From: Dionicio Stall, CMA Sent: 10/30/2020  12:58 PM EDT To: Benjiman Core, MD  Dr. Clelia Croft,  Patient called reporting that she would like to cancel her upcoming appointments for Ferrlecit today and on 8/26. She left this message with Jaquita Rector a medical secretary with [facility] states that the patient reported to her that she would be out of town for a while.   Patient requested a call back from me to confirm that this was okay with you. I tried calling the patient but had to leave a voicemail. Infusion for today was cancelled, and I will cancel infusion for 8/26 as she will not be in town. Should we keep her Lab & MD appointment for 11/18?  Thank you, Baird Lyons

## 2020-11-06 ENCOUNTER — Inpatient Hospital Stay: Payer: BC Managed Care – PPO

## 2021-01-29 ENCOUNTER — Other Ambulatory Visit: Payer: Self-pay

## 2021-01-29 ENCOUNTER — Inpatient Hospital Stay: Payer: BC Managed Care – PPO | Attending: Oncology

## 2021-01-29 ENCOUNTER — Inpatient Hospital Stay (HOSPITAL_BASED_OUTPATIENT_CLINIC_OR_DEPARTMENT_OTHER): Payer: BC Managed Care – PPO | Admitting: Oncology

## 2021-01-29 ENCOUNTER — Other Ambulatory Visit: Payer: Self-pay | Admitting: *Deleted

## 2021-01-29 VITALS — BP 145/85 | HR 88 | Temp 97.2°F | Resp 18 | Wt 297.1 lb

## 2021-01-29 DIAGNOSIS — D72829 Elevated white blood cell count, unspecified: Secondary | ICD-10-CM | POA: Insufficient documentation

## 2021-01-29 DIAGNOSIS — N920 Excessive and frequent menstruation with regular cycle: Secondary | ICD-10-CM | POA: Diagnosis present

## 2021-01-29 DIAGNOSIS — D509 Iron deficiency anemia, unspecified: Secondary | ICD-10-CM

## 2021-01-29 DIAGNOSIS — D500 Iron deficiency anemia secondary to blood loss (chronic): Secondary | ICD-10-CM | POA: Insufficient documentation

## 2021-01-29 DIAGNOSIS — D75839 Thrombocytosis, unspecified: Secondary | ICD-10-CM | POA: Diagnosis not present

## 2021-01-29 LAB — FERRITIN: Ferritin: 51 ng/mL (ref 11–307)

## 2021-01-29 LAB — CBC WITH DIFFERENTIAL (CANCER CENTER ONLY)
Abs Immature Granulocytes: 0.04 10*3/uL (ref 0.00–0.07)
Basophils Absolute: 0.1 10*3/uL (ref 0.0–0.1)
Basophils Relative: 0 %
Eosinophils Absolute: 0.7 10*3/uL — ABNORMAL HIGH (ref 0.0–0.5)
Eosinophils Relative: 4 %
HCT: 36 % (ref 36.0–46.0)
Hemoglobin: 11 g/dL — ABNORMAL LOW (ref 12.0–15.0)
Immature Granulocytes: 0 %
Lymphocytes Relative: 25 %
Lymphs Abs: 3.8 10*3/uL (ref 0.7–4.0)
MCH: 23 pg — ABNORMAL LOW (ref 26.0–34.0)
MCHC: 30.6 g/dL (ref 30.0–36.0)
MCV: 75.2 fL — ABNORMAL LOW (ref 80.0–100.0)
Monocytes Absolute: 0.8 10*3/uL (ref 0.1–1.0)
Monocytes Relative: 5 %
Neutro Abs: 10.1 10*3/uL — ABNORMAL HIGH (ref 1.7–7.7)
Neutrophils Relative %: 66 %
Platelet Count: 335 10*3/uL (ref 150–400)
RBC: 4.79 MIL/uL (ref 3.87–5.11)
RDW: 17.9 % — ABNORMAL HIGH (ref 11.5–15.5)
WBC Count: 15.5 10*3/uL — ABNORMAL HIGH (ref 4.0–10.5)
nRBC: 0 % (ref 0.0–0.2)

## 2021-01-29 LAB — IRON AND TIBC
Iron: 23 ug/dL — ABNORMAL LOW (ref 41–142)
Saturation Ratios: 8 % — ABNORMAL LOW (ref 21–57)
TIBC: 305 ug/dL (ref 236–444)
UIBC: 282 ug/dL (ref 120–384)

## 2021-01-29 NOTE — Progress Notes (Signed)
Hematology and Oncology Follow Up Visit  Melody Gates [id number] 02-02-1991 30 y.o. 01/29/2021 11:55 AM Patient, No Pcp Per (Inactive)Karisma Meiser, Blenda Nicely, MD   Principle Diagnosis: 30 year old woman with anemia diagnosed in October 2021.  She was found to have iron deficiency due to chronic menstrual losses.   Prior Therapy:  Ferric gluconate given between in February 07, 2020 till February 20, 2020. Ferric gluconate was given again in April 2022.  Current therapy: Active surveillance and under consideration for repeat infusion.  Interim History: Melody Gates returns today for a follow-up visit.  Since the last visit, she reports of feeling well without any recent complaints.  She has reported more fatigue and tiredness however although no menorrhagia or hematochezia.  He denies any dyspnea on exertion or shortness of breath.  Her performance status quality of life remained excellent.      Medications: Reviewed without changes. Current Outpatient Medications  Medication Sig Dispense Refill   benzonatate (TESSALON) 100 MG capsule Take 1 capsule by mouth every 8 (eight) hours for cough. 21 capsule 0   cholecalciferol (VITAMIN D3) 25 MCG (1000 UNIT) tablet Take 1,000 Units by mouth daily.     doxycycline (VIBRAMYCIN) 100 MG capsule Take 1 capsule (100 mg total) by mouth 2 (two) times daily. 20 capsule 0   hydrochlorothiazide (HYDRODIURIL) 25 MG tablet Take 25 mg by mouth daily.     losartan (COZAAR) 100 MG tablet Take 100 mg by mouth daily.     norgestimate-ethinyl estradiol (ORTHO-CYCLEN) 0.25-35 MG-MCG tablet Take 2 tablets by mouth daily for 7 days, THEN 1 tablet daily for 7 days. 28 tablet 0   SLYND 4 MG TABS Take 1 tablet by mouth daily.     No current facility-administered medications for this visit.     Allergies:  Allergies  Allergen Reactions   Penicillins Swelling   Amoxicillin       Physical Exam:  Blood pressure (!) 145/85, pulse 88, temperature (!) 97.2 F (36.2  C), temperature source Tympanic, resp. rate 18, weight 297 lb 1 oz (134.7 kg), SpO2 100 %.   ECOG: 0   General appearance: Comfortable appearing without any discomfort Head: Normocephalic without any trauma Oropharynx: Mucous membranes are moist and pink without any thrush or ulcers. Eyes: Pupils are equal and round reactive to light. Lymph nodes: No cervical, supraclavicular, inguinal or axillary lymphadenopathy.   Heart:regular rate and rhythm.  S1 and S2 without leg edema. Lung: Clear without any rhonchi or wheezes.  No dullness to percussion. Abdomin: Soft, nontender, nondistended with good bowel sounds.  No hepatosplenomegaly. Musculoskeletal: No joint deformity or effusion.  Full range of motion noted. Neurological: No deficits noted on motor, sensory and deep tendon reflex exam. Skin: No petechial rash or dryness.  Appeared moist.        Lab Results: Lab Results  Component Value Date   WBC 15.5 (H) 01/29/2021   HGB 11.0 (L) 01/29/2021   HCT 36.0 01/29/2021   MCV 75.2 (L) 01/29/2021   PLT 335 01/29/2021     Chemistry      Component Value Date/Time   NA 133 (L) 12/15/2019 1657   K 3.7 12/15/2019 1657   CL 101 12/15/2019 1657   CO2 23 12/15/2019 1657   BUN 14 12/15/2019 1657   CREATININE 0.79 12/15/2019 1657      Component Value Date/Time   CALCIUM 9.5 12/15/2019 1657   ALKPHOS 45 12/15/2019 1657   AST 12 (L) 12/15/2019 1657   ALT 11 12/15/2019 1657  BILITOT 0.4 12/15/2019 1657        Impression and Plan:   30 year old woman with:   1.   Anemia related to iron deficiency and chronic menstrual blood losses diagnosed in 2021.     She is currently receiving intravenous iron infusion given her poor tolerance to oral iron and and effectiveness.  Laboratory data from today reviewed and shows slight decline in in her hemoglobin and continues to have low MCV and elevated RDW.  These findings likely consistent with persistent iron deficiency.  Risks and  benefits of repeat intravenous iron infusion were discussed.  Complications that include nausea, fatigue, infusion related issues were discussed.  After discussion he is agreeable to proceed and we will arrange for iron sucrose in the near future for a total of 800 mg.     2.  Leukocytosis: Appears to be reactive in nature without any evidence of a hematological disorder.     3.  Thrombocytosis: Related to iron deficiency and improved at this time.   4.  Menorrhagia: Last menstrual cycle has been few months ago and managed on hormone therapy.   5.  Follow-up: In 4 months for repeat follow-up.   30  minutes were spent on this visit.  Time was dedicated to reviewing laboratory data, disease status update and outlining future plan of care.     Eli Hose, MD 11/18/202211:55 AM

## 2021-02-08 ENCOUNTER — Inpatient Hospital Stay: Payer: BC Managed Care – PPO

## 2021-02-08 ENCOUNTER — Other Ambulatory Visit: Payer: Self-pay

## 2021-02-08 ENCOUNTER — Encounter: Payer: Self-pay | Admitting: Oncology

## 2021-02-08 VITALS — BP 138/71 | HR 72 | Temp 98.8°F | Resp 16 | Wt 300.0 lb

## 2021-02-08 DIAGNOSIS — D509 Iron deficiency anemia, unspecified: Secondary | ICD-10-CM

## 2021-02-08 DIAGNOSIS — D500 Iron deficiency anemia secondary to blood loss (chronic): Secondary | ICD-10-CM | POA: Diagnosis not present

## 2021-02-08 MED ORDER — SODIUM CHLORIDE 0.9 % IV SOLN: Freq: Once | INTRAVENOUS | Status: AC

## 2021-02-08 MED ORDER — SODIUM CHLORIDE 0.9 % IV SOLN
200.0000 mg | Freq: Once | INTRAVENOUS | Status: AC
  Administered 2021-02-08: 15:00:00 200 mg via INTRAVENOUS
  Filled 2021-02-08: qty 200

## 2021-02-08 NOTE — Patient Instructions (Signed)

## 2021-02-16 ENCOUNTER — Telehealth: Payer: Self-pay | Admitting: Oncology

## 2021-02-16 NOTE — Telephone Encounter (Signed)
Scheduled per sch msg. Called and spoke with patient. Confirmed appts  

## 2021-02-19 ENCOUNTER — Inpatient Hospital Stay: Payer: BC Managed Care – PPO | Attending: Oncology

## 2021-02-19 ENCOUNTER — Other Ambulatory Visit: Payer: Self-pay

## 2021-02-19 VITALS — BP 138/64 | HR 66 | Temp 98.4°F | Resp 18

## 2021-02-19 DIAGNOSIS — D509 Iron deficiency anemia, unspecified: Secondary | ICD-10-CM

## 2021-02-19 DIAGNOSIS — D500 Iron deficiency anemia secondary to blood loss (chronic): Secondary | ICD-10-CM | POA: Diagnosis not present

## 2021-02-19 MED ORDER — SODIUM CHLORIDE 0.9 % IV SOLN
200.0000 mg | Freq: Once | INTRAVENOUS | Status: AC
  Administered 2021-02-19: 200 mg via INTRAVENOUS
  Filled 2021-02-19: qty 200

## 2021-02-19 MED ORDER — SODIUM CHLORIDE 0.9 % IV SOLN: Freq: Once | INTRAVENOUS | Status: AC

## 2021-02-19 NOTE — Progress Notes (Signed)
Pt declined to stay for entire 30 min post observation, observed for 15 min, discharged with VSS.

## 2021-02-19 NOTE — Patient Instructions (Signed)

## 2021-02-26 ENCOUNTER — Other Ambulatory Visit: Payer: Self-pay

## 2021-02-26 ENCOUNTER — Inpatient Hospital Stay: Payer: BC Managed Care – PPO

## 2021-02-26 VITALS — BP 144/100 | HR 84 | Temp 98.9°F | Resp 18

## 2021-02-26 DIAGNOSIS — D509 Iron deficiency anemia, unspecified: Secondary | ICD-10-CM

## 2021-02-26 DIAGNOSIS — D500 Iron deficiency anemia secondary to blood loss (chronic): Secondary | ICD-10-CM | POA: Diagnosis not present

## 2021-02-26 MED ORDER — SODIUM CHLORIDE 0.9 % IV SOLN: Freq: Once | INTRAVENOUS | Status: AC

## 2021-02-26 MED ORDER — SODIUM CHLORIDE 0.9 % IV SOLN
200.0000 mg | Freq: Once | INTRAVENOUS | Status: AC
  Administered 2021-02-26: 200 mg via INTRAVENOUS
  Filled 2021-02-26: qty 200

## 2021-02-26 NOTE — Progress Notes (Signed)
Patient tolerated IV iron infusion well. No complaints, declined to stayed for 30 minute post-observation period. VSS, ambulatory to lobby.

## 2021-02-26 NOTE — Patient Instructions (Signed)

## 2021-03-05 ENCOUNTER — Inpatient Hospital Stay: Payer: BC Managed Care – PPO

## 2021-03-09 ENCOUNTER — Telehealth: Payer: Self-pay | Admitting: Oncology

## 2021-03-09 NOTE — Telephone Encounter (Signed)
R/s infusion per 12/26 pt message due to pt tested covid + on 12/25. Pt has been called and confirmed new appt

## 2021-03-12 ENCOUNTER — Inpatient Hospital Stay: Payer: BC Managed Care – PPO

## 2021-03-23 ENCOUNTER — Telehealth: Payer: Self-pay | Admitting: Oncology

## 2021-03-23 NOTE — Telephone Encounter (Signed)
Rescheduled per 1/10 sch msg, message has been left with pt

## 2021-03-29 ENCOUNTER — Inpatient Hospital Stay: Payer: BC Managed Care – PPO

## 2021-03-29 ENCOUNTER — Other Ambulatory Visit: Payer: Self-pay

## 2021-03-29 ENCOUNTER — Inpatient Hospital Stay: Payer: BC Managed Care – PPO | Attending: Oncology

## 2021-03-29 VITALS — BP 142/88 | HR 93 | Temp 98.4°F | Resp 18

## 2021-03-29 DIAGNOSIS — D509 Iron deficiency anemia, unspecified: Secondary | ICD-10-CM

## 2021-03-29 DIAGNOSIS — D500 Iron deficiency anemia secondary to blood loss (chronic): Secondary | ICD-10-CM | POA: Insufficient documentation

## 2021-03-29 MED ORDER — SODIUM CHLORIDE 0.9 % IV SOLN: Freq: Once | INTRAVENOUS | Status: AC

## 2021-03-29 MED ORDER — SODIUM CHLORIDE 0.9 % IV SOLN
200.0000 mg | Freq: Once | INTRAVENOUS | Status: AC
  Administered 2021-03-29: 200 mg via INTRAVENOUS
  Filled 2021-03-29: qty 200

## 2021-03-29 NOTE — Progress Notes (Signed)
Patient declined to stay for observation period following iron infusion. Vital signs stable at discharge.

## 2021-03-29 NOTE — Patient Instructions (Signed)

## 2021-05-26 ENCOUNTER — Encounter (HOSPITAL_COMMUNITY): Payer: Self-pay

## 2021-05-26 ENCOUNTER — Emergency Department (HOSPITAL_COMMUNITY)
Admission: EM | Admit: 2021-05-26 | Discharge: 2021-05-26 | Disposition: A | Discharge status: Home / Self Care | Payer: BC Managed Care – PPO | Attending: Emergency Medicine | Admitting: Emergency Medicine

## 2021-05-26 DIAGNOSIS — K0381 Cracked tooth: Secondary | ICD-10-CM | POA: Diagnosis not present

## 2021-05-26 DIAGNOSIS — K029 Dental caries, unspecified: Secondary | ICD-10-CM | POA: Diagnosis not present

## 2021-05-26 DIAGNOSIS — K0889 Other specified disorders of teeth and supporting structures: Secondary | ICD-10-CM | POA: Diagnosis present

## 2021-05-26 MED ORDER — ONDANSETRON 4 MG PO TBDP
4.0000 mg | ORAL_TABLET | Freq: Once | ORAL | Status: AC
Start: 2021-05-26 — End: 2021-05-26
  Administered 2021-05-26: 4 mg via ORAL

## 2021-05-26 MED ORDER — OXYCODONE-ACETAMINOPHEN 5-325 MG PO TABS: 1.0000 | ORAL_TABLET | Freq: Four times a day (QID) | ORAL | 0 refills | Status: DC | PRN

## 2021-05-26 MED ORDER — OXYCODONE-ACETAMINOPHEN 5-325 MG PO TABS
1.0000 | ORAL_TABLET | Freq: Once | ORAL | Status: AC
  Administered 2021-05-26: 1 via ORAL
  Filled 2021-05-26: qty 1

## 2021-05-26 NOTE — Discharge Instructions (Addendum)
Follow up with your dentist  ?Return immediately back to the ER if: ? ?Your symptoms worsen within the next 12-24 hours. ?You develop new symptoms such as new fevers, persistent vomiting, new pain, shortness of breath, or new weakness or numbness, or if you have any other concerns. ? ?

## 2021-05-26 NOTE — ED Triage Notes (Signed)
Pt arrived via POV c/o left sided dental pain. Was seen by dentist, given abx, current day three of abx with no improvement. Pain worsening.  ?

## 2021-05-26 NOTE — ED Notes (Signed)
Patient dry heaving when attempting to administer PO pain medication, MD aware.   ?

## 2021-05-26 NOTE — ED Provider Notes (Signed)
?[facility]-EMERGENCY DEPT ?Provider Note ? ? ?CSN: [id number] ?Arrival date & time: 05/26/21  1743 ? ?  ? ?History ? ?Chief Complaint  ?Patient presents with  ? Dental Pain  ? ? ?Melody Gates is a 31 y.o. female. ? ?Patient presents with complaint of dental pain.  She states has had dental pain off and on for several years.  She just saw her dentist and work out a payment plan and they are planning on extraction.  She was started on antibiotic which he has been taking for 3 days, still having some dental pain.  Taking ibuprofen at home as well without significant improvement.  Denies fevers or cough or vomiting or diarrhea. ? ? ?  ? ?Home Medications ?Prior to Admission medications   ?Medication Sig Start Date End Date Taking? Authorizing Provider  ?oxyCODONE-acetaminophen (PERCOCET/ROXICET) 5-325 MG tablet Take 1 tablet by mouth every 6 (six) hours as needed for up to 8 doses for severe pain. 05/26/21  Yes Cheryll Cockayne, MD  ?benzonatate (TESSALON) 100 MG capsule Take 1 capsule by mouth every 8 (eight) hours for cough. 04/30/20   Mardella Layman, MD  ?cholecalciferol (VITAMIN D3) 25 MCG (1000 UNIT) tablet Take 1,000 Units by mouth daily.    [provider]  ?clindamycin (CLEOCIN) 300 MG capsule Take 300 mg by mouth 2 (two) times daily. 02/18/21   [provider]  ?doxycycline (VIBRAMYCIN) 100 MG capsule Take 1 capsule (100 mg total) by mouth 2 (two) times daily. 04/30/20   Mardella Layman, MD  ?hydrochlorothiazide (HYDRODIURIL) 25 MG tablet Take 25 mg by mouth daily. 12/12/19   [provider]  ?losartan (COZAAR) 100 MG tablet Take 100 mg by mouth daily. 12/12/19   [provider]  ?naproxen (NAPROSYN) 500 MG tablet Take 500 mg by mouth 2 (two) times daily. 12/09/20   [provider]  ?norgestimate-ethinyl estradiol (ORTHO-CYCLEN) 0.25-35 MG-MCG tablet Take 2 tablets by mouth daily for 7 days, THEN 1 tablet daily for 7 days. 12/15/19 12/29/19  Cristina Gong, PA-C  ?SLYND 4 MG TABS Take 1 tablet by mouth daily. 11/13/19   [provider]  ?   ? ?Allergies    ?Penicillins and Amoxicillin   ? ?Review of Systems   ?Review of Systems  ?Constitutional:  Negative for fever.  ?HENT:  Negative for ear pain.   ?Eyes:  Negative for pain.  ?Respiratory:  Negative for cough.   ?Cardiovascular:  Negative for chest pain.  ?Gastrointestinal:  Negative for abdominal pain.  ?Genitourinary:  Negative for flank pain.  ?Musculoskeletal:  Negative for back pain.  ?Skin:  Negative for rash.  ?Neurological:  Negative for headaches.  ? ?Physical Exam ?Updated Vital Signs ?BP (!) 169/98 (BP Location: Left Arm)   Pulse 86   Temp 97.7 ?F (36.5 ?C) (Oral)   Resp 18   SpO2 99%  ?Physical Exam ?Constitutional:   ?   General: She is not in acute distress. ?   Appearance: Normal appearance.  ?HENT:  ?   Head: Normocephalic.  ?   Nose: Nose normal.  ?   Mouth/Throat:  ?   Comments: Multiple chipped broken teeth noted.  Dental cavities noted.  No gumline swelling or erythema noted.  Sublingual region is soft no induration.  No clinical symptoms of Ludwick's angina. ?Eyes:  ?   Extraocular Movements: Extraocular movements intact.  ?Cardiovascular:  ?   Rate and Rhythm: Normal rate.  ?Pulmonary:  ?   Effort: Pulmonary effort  is normal.  ?Musculoskeletal:     ?   General: Normal range of motion.  ?   Cervical back: Normal range of motion.  ?Neurological:  ?   General: No focal deficit present.  ?   Mental Status: She is alert. Mental status is at baseline.  ? ? ?ED Results / Procedures / Treatments   ?Labs ?(all labs ordered are listed, but only abnormal results are displayed) ?Labs Reviewed - No data to display ? ?EKG ?None ? ?Radiology ?No results found. ? ?Procedures ?Procedures  ? ? ?Medications Ordered in ED ?Medications  ?oxyCODONE-acetaminophen (PERCOCET/ROXICET) 5-325 MG per tablet 1 tablet (has no administration in time range)  ? ? ?ED Course/ Medical Decision Making/ A&P ?   ?                        ?Medical Decision Making ?Risk ?Prescription drug management. ? ? ?Chart review shows appointment with oncology March 23, 2021. ? ?No additional tests were ordered.  Clinical suspicion of dental pain.  No clinical evidence of abscess or other localized soft tissue swelling noted.  Patient already on antibiotics.  Given Percocet here, advised outpatient follow-up with her dentist in 3 to 4 days.  Advised return for fevers worsening symptoms or any additional concerns. ? ? ? ? ? ? ? ?Final Clinical Impression(s) / ED Diagnoses ?Final diagnoses:  ?Pain, dental  ? ? ?Rx / DC Orders ?ED Discharge Orders   ? ?      Ordered  ?  oxyCODONE-acetaminophen (PERCOCET/ROXICET) 5-325 MG tablet  Every 6 hours PRN       ? 05/26/21 1809  ? ?  ?  ? ?  ? ? ?  ?Cheryll Cockayne, MD ?05/26/21 1811 ? ?

## 2021-05-27 ENCOUNTER — Other Ambulatory Visit: Payer: Self-pay | Admitting: Oncology

## 2021-05-27 ENCOUNTER — Telehealth: Payer: Self-pay | Admitting: Oncology

## 2021-05-27 NOTE — Telephone Encounter (Signed)
Scheduled per 3/16 in basket, pt has been called and confirmed ?

## 2021-05-28 ENCOUNTER — Ambulatory Visit: Payer: BC Managed Care – PPO | Admitting: Oncology

## 2021-05-28 ENCOUNTER — Other Ambulatory Visit: Payer: BC Managed Care – PPO

## 2021-06-08 ENCOUNTER — Telehealth: Payer: Self-pay | Admitting: Oncology

## 2021-06-08 NOTE — Telephone Encounter (Signed)
Rescheduled 04/14 appointment to 04/07 due to provider on-call. Called and left a voicemail. ?

## 2021-06-18 ENCOUNTER — Inpatient Hospital Stay: Payer: BC Managed Care – PPO | Admitting: Oncology

## 2021-06-18 ENCOUNTER — Inpatient Hospital Stay: Payer: BC Managed Care – PPO | Attending: Oncology

## 2021-06-22 ENCOUNTER — Telehealth: Payer: Self-pay | Admitting: Oncology

## 2021-06-22 NOTE — Telephone Encounter (Signed)
Per 4/7 los rescheduled missed appointment left message for pt with dates of new appointments.  Left call back number is changes are needed.  ?

## 2021-06-25 ENCOUNTER — Ambulatory Visit: Payer: BC Managed Care – PPO | Admitting: Oncology

## 2021-06-25 ENCOUNTER — Other Ambulatory Visit: Payer: BC Managed Care – PPO

## 2021-07-07 ENCOUNTER — Telehealth: Payer: Self-pay | Admitting: *Deleted

## 2021-07-07 ENCOUNTER — Inpatient Hospital Stay: Payer: BC Managed Care – PPO

## 2021-07-07 ENCOUNTER — Inpatient Hospital Stay: Payer: BC Managed Care – PPO | Admitting: Oncology

## 2021-07-07 NOTE — Telephone Encounter (Signed)
PC to patient regarding missed appointments today, no answer, left VM - instructed patient to call scheduling department to reschedule appointments, [phone]. ?

## 2021-07-16 DIAGNOSIS — I10 Essential (primary) hypertension: Secondary | ICD-10-CM | POA: Insufficient documentation

## 2021-07-25 LAB — HM PAP SMEAR

## 2021-09-20 ENCOUNTER — Telehealth: Payer: BC Managed Care – PPO | Admitting: Physician Assistant

## 2021-09-20 DIAGNOSIS — J208 Acute bronchitis due to other specified organisms: Secondary | ICD-10-CM | POA: Diagnosis not present

## 2021-09-20 DIAGNOSIS — B9689 Other specified bacterial agents as the cause of diseases classified elsewhere: Secondary | ICD-10-CM | POA: Diagnosis not present

## 2021-09-20 MED ORDER — BENZONATATE 100 MG PO CAPS: 100.0000 mg | ORAL_CAPSULE | Freq: Three times a day (TID) | ORAL | 0 refills | Status: DC | PRN

## 2021-09-20 MED ORDER — AZITHROMYCIN 250 MG PO TABS: ORAL_TABLET | ORAL | 0 refills | Status: AC

## 2021-09-20 NOTE — Progress Notes (Signed)
We are sorry that you are not feeling well.  Here is how we plan to help!  Based on your presentation I believe you most likely have A cough due to bacteria.  When patients have a fever and a productive cough with a change in color or increased sputum production, we are concerned about bacterial bronchitis.  If left untreated it can progress to pneumonia.  If your symptoms do not improve with your treatment plan it is important that you contact your provider.   I have prescribed Azithromyin 250 mg: two tablets now and then one tablet daily for 4 additonal days    In addition you may use A non-prescription cough medication called Mucinex DM: take 2 tablets every 12 hours. and A prescription cough medication called Tessalon Perles 100mg. You may take 1-2 capsules every 8 hours as needed for your cough.   From your responses in the eVisit questionnaire you describe inflammation in the upper respiratory tract which is causing a significant cough.  This is commonly called Bronchitis and has four common causes:   Allergies Viral Infections Acid Reflux Bacterial Infection Allergies, viruses and acid reflux are treated by controlling symptoms or eliminating the cause. An example might be a cough caused by taking certain blood pressure medications. You stop the cough by changing the medication. Another example might be a cough caused by acid reflux. Controlling the reflux helps control the cough.  USE OF BRONCHODILATOR ("RESCUE") INHALERS: There is a risk from using your bronchodilator too frequently.  The risk is that over-reliance on a medication which only relaxes the muscles surrounding the breathing tubes can reduce the effectiveness of medications prescribed to reduce swelling and congestion of the tubes themselves.  Although you feel brief relief from the bronchodilator inhaler, your asthma may actually be worsening with the tubes becoming more swollen and filled with mucus.  This can delay other  crucial treatments, such as oral steroid medications. If you need to use a bronchodilator inhaler daily, several times per day, you should discuss this with your provider.  There are probably better treatments that could be used to keep your asthma under control.     HOME CARE Only take medications as instructed by your medical team. Complete the entire course of an antibiotic. Drink plenty of fluids and get plenty of rest. Avoid close contacts especially the very young and the elderly Cover your mouth if you cough or cough into your sleeve. Always remember to wash your hands A steam or ultrasonic humidifier can help congestion.   GET HELP RIGHT AWAY IF: You develop worsening fever. You become short of breath You cough up blood. Your symptoms persist after you have completed your treatment plan MAKE SURE YOU  Understand these instructions. Will watch your condition. Will get help right away if you are not doing well or get worse.    Thank you for choosing an e-visit.  Your e-visit answers were reviewed by a board certified advanced clinical practitioner to complete your personal care plan. Depending upon the condition, your plan could have included both over the counter or prescription medications.  Please review your pharmacy choice. Make sure the pharmacy is open so you can pick up prescription now. If there is a problem, you may contact your provider through MyChart messaging and have the prescription routed to another pharmacy.  Your safety is important to us. If you have drug allergies check your prescription carefully.   For the next 24 hours you can use   MyChart to ask questions about today's visit, request a non-urgent call back, or ask for a work or school excuse. You will get an email in the next two days asking about your experience. I hope that your e-visit has been valuable and will speed your recovery.  I provided 5 minutes of non face-to-face time during this encounter  for chart review and documentation.   

## 2021-09-21 ENCOUNTER — Ambulatory Visit: Payer: BC Managed Care – PPO

## 2021-09-24 ENCOUNTER — Other Ambulatory Visit: Payer: Self-pay

## 2021-09-24 ENCOUNTER — Ambulatory Visit
Admission: EM | Admit: 2021-09-24 | Discharge: 2021-09-24 | Disposition: A | Discharge status: Home / Self Care | Payer: BC Managed Care – PPO | Attending: Student | Admitting: Student

## 2021-09-24 DIAGNOSIS — Z3202 Encounter for pregnancy test, result negative: Secondary | ICD-10-CM

## 2021-09-24 DIAGNOSIS — B9689 Other specified bacterial agents as the cause of diseases classified elsewhere: Secondary | ICD-10-CM

## 2021-09-24 DIAGNOSIS — J208 Acute bronchitis due to other specified organisms: Secondary | ICD-10-CM | POA: Diagnosis not present

## 2021-09-24 DIAGNOSIS — K30 Functional dyspepsia: Secondary | ICD-10-CM

## 2021-09-24 LAB — POCT URINE PREGNANCY: Preg Test, Ur: NEGATIVE

## 2021-09-24 LAB — POCT FASTING CBG KUC MANUAL ENTRY: POCT Glucose (KUC): 119 mg/dL — AB (ref 70–99)

## 2021-09-24 MED ORDER — ALUM & MAG HYDROXIDE-SIMETH 200-200-20 MG/5ML PO SUSP
30.0000 mL | Freq: Once | ORAL | Status: AC
  Administered 2021-09-24: 30 mL via ORAL

## 2021-09-24 NOTE — ED Provider Notes (Signed)
[facility]    CSN: [id number] Arrival date & time: 09/24/21  1531      History   Chief Complaint Chief Complaint  Patient presents with   Heartburn    Shortness of breathe and dizziness - Entered by patient    HPI Sulaf Finley is a 31 y.o. female presenting with epigastric burning. History anemia, hypertension. Follows with oncology for iron deficiency, last visit with them was 05/27/21. States 3 days of epigastric burning. Denies NSAIDs, spicy foods, rich foods, etoh. Hasn't attempted medications for the symptoms. Denies n/v/d/hematemesis, hematochezia, melena. Also with dyspnea on exertion for the last week, has followed with PCP for this already on 7/10 and is taking z-pack as directed. Also with facial pressure worst with movement, she states this makes her feel lightheaded with movement. Denies worst headache of life, thunderclap headache, weakness/sensation changes in arms/legs, vision changes, shortness of breath, chest pain/pressure, photophobia, phonophobia, n/v/d.    HPI  Past Medical History:  Diagnosis Date   Anemia    Hypertension     Patient Active Problem List   Diagnosis Date Noted   Iron deficiency anemia 01/29/2020    History reviewed. No pertinent surgical history.  OB History   No obstetric history on file.      Home Medications    Prior to Admission medications   Medication Sig Start Date End Date Taking? Authorizing Provider  azithromycin (ZITHROMAX) 250 MG tablet Take 2 tablets on day 1, then 1 tablet daily on days 2 through 5 09/20/21 09/25/21  Mar Daring, PA-C  benzonatate (TESSALON) 100 MG capsule Take 1 capsule (100 mg total) by mouth 3 (three) times daily as needed. 09/20/21   Mar Daring, PA-C  hydrochlorothiazide (HYDRODIURIL) 25 MG tablet Take 25 mg by mouth daily. 12/12/19   [provider]  losartan (COZAAR) 100 MG tablet Take 100 mg by mouth daily. 12/12/19   [provider]    Family  History History reviewed. No pertinent family history.  Social History Social History   Tobacco Use   Smoking status: Former   Smokeless tobacco: Never  Scientific laboratory technician Use: Never used  Substance Use Topics   Alcohol use: Not Currently   Drug use: Never     Allergies   Penicillins and Amoxicillin   Review of Systems Review of Systems  Gastrointestinal:  Positive for abdominal pain.  All other systems reviewed and are negative.    Physical Exam Triage Vital Signs ED Triage Vitals [09/24/21 1551]  Enc Vitals Group     BP 121/72     Pulse Rate 92     Resp 18     Temp 98.7 F (37.1 C)     Temp Source Oral     SpO2 96 %     Weight      Height      Head Circumference      Peak Flow      Pain Score 0     Pain Loc      Pain Edu?      Excl. in [place]?    No data found.  Updated Vital Signs BP 121/72 (BP Location: Left Arm)   Pulse 92   Temp 98.7 F (37.1 C) (Oral)   Resp 18   SpO2 96%   Visual Acuity Right Eye Distance:   Left Eye Distance:   Bilateral Distance:    Right Eye Near:   Left Eye Near:    Bilateral  Near:     Physical Exam Vitals reviewed.  Constitutional:      General: She is not in acute distress.    Appearance: Normal appearance. She is not ill-appearing.     Comments: Ambulates into room unassisted and in no distress.   HENT:     Head: Normocephalic and atraumatic.     Mouth/Throat:     Mouth: Mucous membranes are moist.     Comments: Moist mucous membranes Eyes:     Extraocular Movements: Extraocular movements intact.     Pupils: Pupils are equal, round, and reactive to light.  Cardiovascular:     Rate and Rhythm: Normal rate and regular rhythm.     Heart sounds: Normal heart sounds.  Pulmonary:     Effort: Pulmonary effort is normal.     Breath sounds: Normal breath sounds. No wheezing, rhonchi or rales.  Abdominal:     General: Bowel sounds are normal. There is no distension.     Palpations: Abdomen is soft. There is no  mass.     Tenderness: There is abdominal tenderness in the epigastric area. There is no right CVA tenderness, left CVA tenderness, guarding or rebound.     Comments: Exam limited due to body habitus.  Epigastric tenderness to palpation. Comfortable throughout exam.   Skin:    General: Skin is warm.     Capillary Refill: Capillary refill takes less than 2 seconds.     Comments: Good skin turgor  Neurological:     General: No focal deficit present.     Mental Status: She is alert and oriented to person, place, and time.     Comments: PERRLA, EOMI. CN 2-12 grossly intact. Strength and sensation grossly intact. Negative rhomberg, pronator drift, fingers to thumb. Gait intact.   Psychiatric:        Mood and Affect: Mood normal.        Behavior: Behavior normal.      UC Treatments / Results  Labs (all labs ordered are listed, but only abnormal results are displayed) Labs Reviewed  POCT FASTING CBG [facility] MANUAL ENTRY - Abnormal; Notable for the following components:      Result Value   POCT Glucose ([facility]) 119 (*)    All other components within normal limits  POCT URINE PREGNANCY    EKG   Radiology No results found.  Procedures Procedures (including critical care time)  Medications Ordered in UC Medications  alum & mag hydroxide-simeth (MAALOX/MYLANTA) 200-200-20 MG/5ML suspension 30 mL (30 mLs Oral Given 09/24/21 1628)    Initial Impression / Assessment and Plan / UC Course  I have reviewed the triage vital signs and the nursing notes.  Pertinent labs & imaging results that were available during my care of the patient were reviewed by me and considered in my medical decision making (see chart for details).     This patient is a very pleasant 31 y.o. year old female presenting with indigestion, suspect this is related to Z-Pak that she was prescribed for bacterial bronchitis on 7/10.  Her pulmonary symptoms and dyspnea are improving.  She does have a history of iron deficiency  anemia, states she is followed by oncology for this last appointment 05/2021, she has been cleared by them.  Denies recent heavy periods or episodes of bleeding. EKG NSR today.  History PCOS, nonfasting CBG 119.  U pregnant negative.  She is neurologically intact.  Symptoms improved following GI cocktail.  Discussed that while I cannot completely exclude intracranial  pathology in the urgent care setting, overall picture is reassuring.  She verbalizes if her dizziness changes, including new headaches, new vision changes-she will head to the emergency department or call 911.  Final Clinical Impressions(s) / UC Diagnoses   Final diagnoses:  Indigestion  Negative pregnancy test  Acute bacterial bronchitis   Discharge Instructions   None    ED Prescriptions   None    PDMP not reviewed this encounter.   Rhys Martini, PA-C 09/24/21 1708

## 2021-09-24 NOTE — ED Triage Notes (Signed)
Pt present heartburn with dizziness and SOB. Symptoms started four days ago. Pt tried otc medication with no relief.

## 2021-09-24 NOTE — Discharge Instructions (Addendum)
-  While I cannot exclude intracranial pathology in the UC setting (lesion, bleed, etc), it's a good sign that your symptoms are improving with the GI cocktail -Complete z-pack as directed for bacterial bronchitis

## 2021-11-27 ENCOUNTER — Ambulatory Visit: Payer: BC Managed Care – PPO | Admitting: Family Medicine

## 2021-12-13 ENCOUNTER — Ambulatory Visit: Payer: BC Managed Care – PPO | Admitting: Family Medicine

## 2021-12-13 ENCOUNTER — Encounter: Payer: Self-pay | Admitting: Family Medicine

## 2021-12-13 VITALS — BP 137/83 | HR 88 | Temp 98.7°F | Resp 16 | Ht 66.0 in | Wt 311.8 lb

## 2021-12-13 DIAGNOSIS — Z0000 Encounter for general adult medical examination without abnormal findings: Secondary | ICD-10-CM | POA: Diagnosis not present

## 2021-12-13 DIAGNOSIS — Z130 Encounter for screening for diseases of the blood and blood-forming organs and certain disorders involving the immune mechanism: Secondary | ICD-10-CM | POA: Diagnosis not present

## 2021-12-13 DIAGNOSIS — Z1329 Encounter for screening for other suspected endocrine disorder: Secondary | ICD-10-CM | POA: Diagnosis not present

## 2021-12-13 DIAGNOSIS — Z13220 Encounter for screening for lipoid disorders: Secondary | ICD-10-CM

## 2021-12-13 DIAGNOSIS — Z7689 Persons encountering health services in other specified circumstances: Secondary | ICD-10-CM

## 2021-12-13 DIAGNOSIS — Z13228 Encounter for screening for other metabolic disorders: Secondary | ICD-10-CM

## 2021-12-14 ENCOUNTER — Encounter: Payer: Self-pay | Admitting: Family Medicine

## 2021-12-14 ENCOUNTER — Ambulatory Visit: Payer: BC Managed Care – PPO | Admitting: Family Medicine

## 2021-12-14 NOTE — Progress Notes (Signed)
New Patient Office Visit  Subjective    Patient ID: Melody Gates, female    DOB: [birth year]  Age: 31 y.o. MRN: [id number]  CC:  Chief Complaint  Patient presents with   Establish Care    HPI Rosebud Koenen presents to establish care and for routine annual exam. Patient denies acute complaints.    Outpatient Encounter Medications as of 12/13/2021  Medication Sig   Metformin HCl 500 MG/5ML SOLN    hydrochlorothiazide (HYDRODIURIL) 25 MG tablet Take 25 mg by mouth daily.   losartan (COZAAR) 100 MG tablet Take 1 tablet by mouth daily.   medroxyPROGESTERone (PROVERA) 10 MG tablet medroxyprogesterone 10 mg tablet   metFORMIN (GLUCOPHAGE) 1000 MG tablet PLEASE SEE ATTACHED FOR DETAILED DIRECTIONS   [DISCONTINUED] benzonatate (TESSALON) 100 MG capsule Take 1 capsule (100 mg total) by mouth 3 (three) times daily as needed.   [DISCONTINUED] losartan (COZAAR) 100 MG tablet Take 100 mg by mouth daily.   No facility-administered encounter medications on file as of 12/13/2021.    Past Medical History:  Diagnosis Date   Anemia    Anxiety    Depression    GERD (gastroesophageal reflux disease)    Hypertension     No past surgical history on file.  Family History  Problem Relation Age of Onset   Hypertension Mother    Diabetes Maternal Grandmother    Stroke Maternal Grandmother    Asthma Sister    Asthma Brother    Diabetes Maternal Uncle    Hypertension Brother    Hypertension Brother     Social History   Socioeconomic History   Marital status: Single    Spouse name: Not on file   Number of children: Not on file   Years of education: Not on file   Highest education level: Not on file  Occupational History   Not on file  Tobacco Use   Smoking status: Former   Smokeless tobacco: Never   Tobacco comments:    Only lasted a few months  Vaping Use   Vaping Use: Never used  Substance and Sexual Activity   Alcohol use: Never   Drug use: Never   Sexual activity: Yes     Birth control/protection: Other-see comments    Comment: Provera when needed for PCOS  Other Topics Concern   Not on file  Social History Narrative   Not on file   Social Determinants of Health   Financial Resource Strain: Not on file  Food Insecurity: Not on file  Transportation Needs: Not on file  Physical Activity: Not on file  Stress: Not on file  Social Connections: Not on file  Intimate Partner Violence: Not on file    Review of Systems  All other systems reviewed and are negative.       Objective    BP 137/83   Pulse 88   Temp 98.7 F (37.1 C) (Bladder)   Resp 16   Ht 5' 6"  (1.676 m)   Wt (!) 311 lb 12.8 oz (141.4 kg)   SpO2 97%   BMI 50.33 kg/m   Physical Exam Vitals and nursing note reviewed.  Constitutional:      General: She is not in acute distress.    Appearance: She is obese.  HENT:     Head: Normocephalic and atraumatic.     Right Ear: Tympanic membrane, ear canal and external ear normal.     Left Ear: Tympanic membrane, ear canal and external ear normal.  Nose: Nose normal.     Mouth/Throat:     Mouth: Mucous membranes are moist.     Pharynx: Oropharynx is clear.  Eyes:     Conjunctiva/sclera: Conjunctivae normal.     Pupils: Pupils are equal, round, and reactive to light.  Neck:     Thyroid: No thyromegaly.  Cardiovascular:     Rate and Rhythm: Normal rate and regular rhythm.     Heart sounds: Normal heart sounds. No murmur heard. Pulmonary:     Effort: Pulmonary effort is normal. No respiratory distress.     Breath sounds: Normal breath sounds.  Abdominal:     General: There is no distension.     Palpations: Abdomen is soft. There is no mass.     Tenderness: There is no abdominal tenderness.  Musculoskeletal:        General: Normal range of motion.     Cervical back: Normal range of motion and neck supple.  Skin:    General: Skin is warm and dry.  Neurological:     General: No focal deficit present.     Mental Status:  She is alert and oriented to person, place, and time.  Psychiatric:        Mood and Affect: Mood normal.        Behavior: Behavior normal.         Assessment & Plan:   1. Annual physical exam  - CMP14+EGFR  2. Screening for deficiency anemia  - CBC with Differential  3. Screening for lipid disorders  - Lipid Panel  4. Screening for endocrine/metabolic/immunity disorders  - TSH - Hemoglobin A1c  5. Encounter to establish care     Return in about 4 weeks (around 01/10/2022).   Becky Sax, MD

## 2021-12-15 ENCOUNTER — Other Ambulatory Visit: Payer: BC Managed Care – PPO

## 2021-12-16 LAB — CBC WITH DIFFERENTIAL/PLATELET

## 2021-12-16 LAB — CMP14+EGFR
ALT: 10 IU/L (ref 0–32)
AST: 15 IU/L (ref 0–40)
Albumin/Globulin Ratio: 1.3 (ref 1.2–2.2)
Albumin: 4.1 g/dL (ref 3.9–4.9)
Alkaline Phosphatase: 72 IU/L (ref 44–121)
BUN/Creatinine Ratio: 11 (ref 9–23)
BUN: 8 mg/dL (ref 6–20)
Bilirubin Total: 0.2 mg/dL (ref 0.0–1.2)
CO2: 21 mmol/L (ref 20–29)
Calcium: 9.5 mg/dL (ref 8.7–10.2)
Chloride: 103 mmol/L (ref 96–106)
Creatinine, Ser: 0.74 mg/dL (ref 0.57–1.00)
Globulin, Total: 3.1 g/dL (ref 1.5–4.5)
Glucose: 86 mg/dL (ref 70–99)
Potassium: 3.8 mmol/L (ref 3.5–5.2)
Sodium: 140 mmol/L (ref 134–144)
Total Protein: 7.2 g/dL (ref 6.0–8.5)
eGFR: 109 mL/min/{1.73_m2} (ref >59)

## 2021-12-16 LAB — LIPID PANEL
Chol/HDL Ratio: 3.7 ratio (ref 0.0–4.4)
Cholesterol, Total: 132 mg/dL (ref 100–199)
HDL: 36 mg/dL — ABNORMAL LOW (ref >39)
LDL Chol Calc (NIH): 75 mg/dL (ref 0–99)
Triglycerides: 117 mg/dL (ref 0–149)
VLDL Cholesterol Cal: 21 mg/dL (ref 5–40)

## 2021-12-16 LAB — HEMOGLOBIN A1C

## 2021-12-16 LAB — TSH: TSH: 3.02 u[IU]/mL (ref 0.450–4.500)

## 2021-12-22 ENCOUNTER — Telehealth: Payer: BC Managed Care – PPO | Admitting: Physician Assistant

## 2021-12-22 DIAGNOSIS — J029 Acute pharyngitis, unspecified: Secondary | ICD-10-CM | POA: Diagnosis not present

## 2021-12-22 MED ORDER — CEPHALEXIN 500 MG PO CAPS: 500.0000 mg | ORAL_CAPSULE | Freq: Two times a day (BID) | ORAL | 0 refills | Status: AC

## 2021-12-22 NOTE — Progress Notes (Signed)
E-Visit for Sore Throat - Strep Symptoms  We are sorry that you are not feeling well.  Here is how we plan to help!  Based on what you have shared with me it is likely that you have strep pharyngitis.  Strep pharyngitis is inflammation and infection in the back of the throat.  This is an infection cause by bacteria and is treated with antibiotics.  I have prescribed Cephalexin 500 mg twice a day for 10 days. For throat pain, we recommend over the counter oral pain relief medications such as acetaminophen or aspirin, or anti-inflammatory medications such as ibuprofen or naproxen sodium. Topical treatments such as oral throat lozenges or sprays may be used as needed. Strep infections are not as easily transmitted as other respiratory infections, however we still recommend that you avoid close contact with loved ones, especially the very young and elderly.  Remember to wash your hands thoroughly throughout the day as this is the number one way to prevent the spread of infection and wipe down door knobs and counters with disinfectant.   Home Care: Only take medications as instructed by your medical team. Complete the entire course of an antibiotic. Do not take these medications with alcohol. A steam or ultrasonic humidifier can help congestion.  You can place a towel over your head and breathe in the steam from hot water coming from a faucet. Avoid close contacts especially the very young and the elderly. Cover your mouth when you cough or sneeze. Always remember to wash your hands.  Get Help Right Away If: You develop worsening fever or sinus pain. You develop a severe head ache or visual changes. Your symptoms persist after you have completed your treatment plan.  Make sure you Understand these instructions. Will watch your condition. Will get help right away if you are not doing well or get worse.   Thank you for choosing an e-visit.  Your e-visit answers were reviewed by a board  certified advanced clinical practitioner to complete your personal care plan. Depending upon the condition, your plan could have included both over the counter or prescription medications.  Please review your pharmacy choice. Make sure the pharmacy is open so you can pick up prescription now. If there is a problem, you may contact your provider through [employer] and have the prescription routed to another pharmacy.  Your safety is important to [place]. If you have drug allergies check your prescription carefully.   For the next 24 hours you can use MyChart to ask questions about today's visit, request a non-urgent call back, or ask for a work or school excuse. You will get an email in the next two days asking about your experience. I hope that your e-visit has been valuable and will speed your recovery.  Approximately 5 minutes was spent documenting and reviewing patient's chart.

## 2022-01-10 ENCOUNTER — Ambulatory Visit (INDEPENDENT_AMBULATORY_CARE_PROVIDER_SITE_OTHER): Payer: BC Managed Care – PPO | Admitting: Family Medicine

## 2022-01-10 ENCOUNTER — Encounter: Payer: Self-pay | Admitting: Family Medicine

## 2022-01-10 VITALS — BP 136/85 | HR 71 | Temp 98.1°F | Resp 16 | Wt 312.0 lb

## 2022-01-10 DIAGNOSIS — I10 Essential (primary) hypertension: Secondary | ICD-10-CM | POA: Diagnosis not present

## 2022-01-10 DIAGNOSIS — Z6841 Body Mass Index (BMI) 40.0 and over, adult: Secondary | ICD-10-CM

## 2022-01-10 NOTE — Progress Notes (Signed)
Established Patient Office Visit  Subjective    Patient ID: Melody Gates, female    DOB: [birth year]  Age: 31 y.o. MRN: [id number]  CC:  Chief Complaint  Patient presents with   Follow-up    HPI Melody Gates presents for routine follow up of hypertension. Patient denies acute complaints or concerns.    Outpatient Encounter Medications as of 01/10/2022  Medication Sig   hydrochlorothiazide (HYDRODIURIL) 25 MG tablet Take 25 mg by mouth daily.   losartan (COZAAR) 100 MG tablet Take 1 tablet by mouth daily.   medroxyPROGESTERone (PROVERA) 10 MG tablet medroxyprogesterone 10 mg tablet   metFORMIN (GLUCOPHAGE) 1000 MG tablet PLEASE SEE ATTACHED FOR DETAILED DIRECTIONS   Metformin HCl 500 MG/5ML SOLN    No facility-administered encounter medications on file as of 01/10/2022.    Past Medical History:  Diagnosis Date   Anemia    Anxiety    Depression    GERD (gastroesophageal reflux disease)    Hypertension     No past surgical history on file.  Family History  Problem Relation Age of Onset   Hypertension Mother    Diabetes Maternal Grandmother    Stroke Maternal Grandmother    Asthma Sister    Asthma Brother    Diabetes Maternal Uncle    Hypertension Brother    Hypertension Brother     Social History   Socioeconomic History   Marital status: Single    Spouse name: Not on file   Number of children: Not on file   Years of education: Not on file   Highest education level: Not on file  Occupational History   Not on file  Tobacco Use   Smoking status: Former   Smokeless tobacco: Never   Tobacco comments:    Only lasted a few months  Vaping Use   Vaping Use: Never used  Substance and Sexual Activity   Alcohol use: Never   Drug use: Never   Sexual activity: Yes    Birth control/protection: Other-see comments    Comment: Provera when needed for PCOS  Other Topics Concern   Not on file  Social History Narrative   Not on file   Social Determinants of  Health   Financial Resource Strain: Not on file  Food Insecurity: Not on file  Transportation Needs: Not on file  Physical Activity: Not on file  Stress: Not on file  Social Connections: Not on file  Intimate Partner Violence: Not on file    Review of Systems  All other systems reviewed and are negative.       Objective    BP 136/85   Pulse 71   Temp 98.1 F (36.7 C) (Oral)   Resp 16   Wt (!) 312 lb (141.5 kg)   SpO2 96%   BMI 50.36 kg/m   Physical Exam Vitals and nursing note reviewed.  Constitutional:      General: She is not in acute distress.    Appearance: She is obese.  Cardiovascular:     Rate and Rhythm: Normal rate and regular rhythm.  Pulmonary:     Effort: Pulmonary effort is normal.     Breath sounds: Normal breath sounds.  Abdominal:     Palpations: Abdomen is soft.     Tenderness: There is no abdominal tenderness.  Neurological:     General: No focal deficit present.     Mental Status: She is alert and oriented to person, place, and time.  Assessment & Plan:   1. Essential hypertension Appears stable. Will continue present management and monitor  2. Class 3 severe obesity due to excess calories with serious comorbidity and body mass index (BMI) of 50.0 to 59.9 in adult [facility]) Discussed dietary and activity options. Goal is 3-5lbs/mo wt loss    Return in about 6 months (around 07/12/2022) for follow up.   Becky Sax, MD

## 2022-01-24 ENCOUNTER — Telehealth: Payer: BC Managed Care – PPO | Admitting: Physician Assistant

## 2022-01-24 DIAGNOSIS — K047 Periapical abscess without sinus: Secondary | ICD-10-CM | POA: Diagnosis not present

## 2022-01-24 MED ORDER — IBUPROFEN 600 MG PO TABS: 600.0000 mg | ORAL_TABLET | Freq: Three times a day (TID) | ORAL | 0 refills | Status: DC | PRN

## 2022-01-24 MED ORDER — CLINDAMYCIN HCL 300 MG PO CAPS: 300.0000 mg | ORAL_CAPSULE | Freq: Four times a day (QID) | ORAL | 0 refills | Status: AC

## 2022-01-24 NOTE — Progress Notes (Signed)

## 2022-04-22 ENCOUNTER — Telehealth: Payer: BC Managed Care – PPO | Admitting: Physician Assistant

## 2022-04-22 ENCOUNTER — Ambulatory Visit: Payer: Self-pay

## 2022-04-22 DIAGNOSIS — B9689 Other specified bacterial agents as the cause of diseases classified elsewhere: Secondary | ICD-10-CM

## 2022-04-22 DIAGNOSIS — J208 Acute bronchitis due to other specified organisms: Secondary | ICD-10-CM | POA: Diagnosis not present

## 2022-04-22 MED ORDER — PROMETHAZINE-DM 6.25-15 MG/5ML PO SYRP: 5.0000 mL | ORAL_SOLUTION | Freq: Four times a day (QID) | ORAL | 0 refills | Status: DC | PRN

## 2022-04-22 MED ORDER — AZITHROMYCIN 250 MG PO TABS: ORAL_TABLET | ORAL | 0 refills | Status: AC

## 2022-04-22 NOTE — Progress Notes (Signed)
We are sorry that you are not feeling well.  Here is how we plan to help!  Based on your presentation I believe you most likely have A cough due to bacteria.  When patients have a fever and a productive cough with a change in color or increased sputum production, we are concerned about bacterial bronchitis.  If left untreated it can progress to pneumonia.  If your symptoms do not improve with your treatment plan it is important that you contact your provider.   I have prescribed Azithromyin 250 mg: two tablets now and then one tablet daily for 4 additonal days    In addition you may use Promethazine DM Take 52m every 6 hours as needed for cough.    From your responses in the eVisit questionnaire you describe inflammation in the upper respiratory tract which is causing a significant cough.  This is commonly called Bronchitis and has four common causes:   Allergies Viral Infections Acid Reflux Bacterial Infection Allergies, viruses and acid reflux are treated by controlling symptoms or eliminating the cause. An example might be a cough caused by taking certain blood pressure medications. You stop the cough by changing the medication. Another example might be a cough caused by acid reflux. Controlling the reflux helps control the cough.  USE OF BRONCHODILATOR ("RESCUE") INHALERS: There is a risk from using your bronchodilator too frequently.  The risk is that over-reliance on a medication which only relaxes the muscles surrounding the breathing tubes can reduce the effectiveness of medications prescribed to reduce swelling and congestion of the tubes themselves.  Although you feel brief relief from the bronchodilator inhaler, your asthma may actually be worsening with the tubes becoming more swollen and filled with mucus.  This can delay other crucial treatments, such as oral steroid medications. If you need to use a bronchodilator inhaler daily, several times per day, you should discuss this with your  provider.  There are probably better treatments that could be used to keep your asthma under control.     HOME CARE Only take medications as instructed by your medical team. Complete the entire course of an antibiotic. Drink plenty of fluids and get plenty of rest. Avoid close contacts especially the very young and the elderly Cover your mouth if you cough or cough into your sleeve. Always remember to wash your hands A steam or ultrasonic humidifier can help congestion.   GET HELP RIGHT AWAY IF: You develop worsening fever. You become short of breath You cough up blood. Your symptoms persist after you have completed your treatment plan MAKE SURE YOU  Understand these instructions. Will watch your condition. Will get help right away if you are not doing well or get worse.    Thank you for choosing an e-visit.  Your e-visit answers were reviewed by a board certified advanced clinical practitioner to complete your personal care plan. Depending upon the condition, your plan could have included both over the counter or prescription medications.  Please review your pharmacy choice. Make sure the pharmacy is open so you can pick up prescription now. If there is a problem, you may contact your provider through [employer] have the prescription routed to another pharmacy.  Your safety is important to [place] If you have drug allergies check your prescription carefully.   For the next 24 hours you can use MyChart to ask questions about today's visit, request a non-urgent call back, or ask for a work or school excuse. You will get  an email in the next two days asking about your experience. I hope that your e-visit has been valuable and will speed your recovery.  I have spent 5 minutes in review of e-visit questionnaire, review and updating patient chart, medical decision making and response to patient.   Mar Daring, PA-C

## 2022-04-22 NOTE — Telephone Encounter (Signed)
ummary: Cough Advice with Vomitting   Pt is calling to report that she has been sick since last Thursday. Pt has been out of work for a week. A deep dry cough to the point that she will vomit. Unable to sleep due to the cough. No appts at [facility] today. Pt is interested in virtual appt.     Chief Complaint: Cough x 1 week, severe, asking to be worked in today. Symptoms: Above Frequency: 1 week ago Pertinent Negatives: Patient denies fever Disposition: []$ ED /[]$ Urgent Care (no appt availability in office) / []$ Appointment(In office/virtual)/ []$  [facility]/ []$ Home Care/ []$ Refused Recommended Disposition /[]$ [facility]/ [x]$  Follow-up with PCP Additional Notes: Please advise pt.  Answer Assessment - Initial Assessment Questions 1. ONSET: "When did the cough begin?"      Last week 2. SEVERITY: "How bad is the cough today?"      Severe 3. SPUTUM: "Describe the color of your sputum" (none, dry cough; clear, white, yellow, green)     Clear 4. HEMOPTYSIS: "Are you coughing up any blood?" If so ask: "How much?" (flecks, streaks, tablespoons, etc.)     No 5. DIFFICULTY BREATHING: "Are you having difficulty breathing?" If Yes, ask: "How bad is it?" (e.g., mild, moderate, severe)    - MILD: No SOB at rest, mild SOB with walking, speaks normally in sentences, can lie down, no retractions, pulse < 100.    - MODERATE: SOB at rest, SOB with minimal exertion and prefers to sit, cannot lie down flat, speaks in phrases, mild retractions, audible wheezing, pulse 100-120.    - SEVERE: Very SOB at rest, speaks in single words, struggling to breathe, sitting hunched forward, retractions, pulse > 120      Moderate 6. FEVER: "Do you have a fever?" If Yes, ask: "What is your temperature, how was it measured, and when did it start?"     Chills 7. CARDIAC HISTORY: "Do you have any history of heart disease?" (e.g., heart attack, congestive heart failure)      No 8. LUNG HISTORY: "Do you have any  history of lung disease?"  (e.g., pulmonary embolus, asthma, emphysema)     No 9. PE RISK FACTORS: "Do you have a history of blood clots?" (or: recent major surgery, recent prolonged travel, bedridden)     No 10. OTHER SYMPTOMS: "Do you have any other symptoms?" (e.g., runny nose, wheezing, chest pain)        11. PREGNANCY: "Is there any chance you are pregnant?" "When was your last menstrual period?"       No 12. TRAVEL: "Have you traveled out of the country in the last month?" (e.g., travel history, exposures)       nO  Protocols used: Cough - Acute Productive-A-AH

## 2022-05-01 ENCOUNTER — Encounter: Payer: Self-pay | Admitting: Oncology

## 2022-07-11 ENCOUNTER — Ambulatory Visit: Payer: BC Managed Care – PPO | Admitting: Family Medicine

## 2022-07-11 ENCOUNTER — Encounter: Payer: Self-pay | Admitting: Family Medicine

## 2022-07-11 VITALS — BP 139/86 | HR 89 | Temp 98.1°F | Resp 16 | Wt 322.6 lb

## 2022-07-11 DIAGNOSIS — F411 Generalized anxiety disorder: Secondary | ICD-10-CM

## 2022-07-11 DIAGNOSIS — I10 Essential (primary) hypertension: Secondary | ICD-10-CM | POA: Diagnosis not present

## 2022-07-11 DIAGNOSIS — Z6841 Body Mass Index (BMI) 40.0 and over, adult: Secondary | ICD-10-CM | POA: Diagnosis not present

## 2022-07-11 NOTE — Progress Notes (Unsigned)
Patient is here for their 6 month follow-up Patient has no concerns today Care gaps have been discussed with patient  

## 2022-07-12 ENCOUNTER — Encounter: Payer: Self-pay | Admitting: Family Medicine

## 2022-07-12 NOTE — Progress Notes (Signed)
Established Patient Office Visit  Subjective    Patient ID: Melody Gates, female    DOB: 08-Aug-1990  Age: 32 y.o. MRN: [id number]  CC:  Chief Complaint  Patient presents with   Follow-up    HPI Melody Gates presents for routine follow up of chronic med issues. Patient denies acute complaints or concerns.    Outpatient Encounter Medications as of 07/11/2022  Medication Sig   Cholecalciferol 1.25 MG (50000 UT) capsule    metFORMIN (GLUCOPHAGE) 1000 MG tablet PLEASE SEE ATTACHED FOR DETAILED DIRECTIONS   Metformin HCl 500 MG/5ML SOLN    promethazine-dextromethorphan (PROMETHAZINE-DM) 6.25-15 MG/5ML syrup Take 5 mLs by mouth 4 (four) times daily as needed for cough.   [DISCONTINUED] hydrochlorothiazide (HYDRODIURIL) 25 MG tablet Take 25 mg by mouth daily.   [DISCONTINUED] ibuprofen (ADVIL) 600 MG tablet Take 1 tablet (600 mg total) by mouth every 8 (eight) hours as needed.   [DISCONTINUED] losartan (COZAAR) 100 MG tablet Take 1 tablet by mouth daily.   [DISCONTINUED] medroxyPROGESTERone (PROVERA) 10 MG tablet medroxyprogesterone 10 mg tablet   No facility-administered encounter medications on file as of 07/11/2022.    Past Medical History:  Diagnosis Date   Anemia    Anxiety    Depression    GERD (gastroesophageal reflux disease)    Hypertension     History reviewed. No pertinent surgical history.  Family History  Problem Relation Age of Onset   Hypertension Mother    Diabetes Maternal Grandmother    Stroke Maternal Grandmother    Asthma Sister    Asthma Brother    Diabetes Maternal Uncle    Hypertension Brother    Hypertension Brother     Social History   Socioeconomic History   Marital status: Single    Spouse name: Not on file   Number of children: Not on file   Years of education: Not on file   Highest education level: 12th grade  Occupational History   Not on file  Tobacco Use   Smoking status: Former   Smokeless tobacco: Never   Tobacco  comments:    Only lasted a few months  Vaping Use   Vaping Use: Never used  Substance and Sexual Activity   Alcohol use: Never   Drug use: Never   Sexual activity: Yes    Birth control/protection: Other-see comments    Comment: Provera when needed for PCOS  Other Topics Concern   Not on file  Social History Narrative   Not on file   Social Determinants of Health   Financial Resource Strain: Low Risk  (07/07/2022)   Overall Financial Resource Strain (CARDIA)    Difficulty of Paying Living Expenses: Not hard at all  Food Insecurity: No Food Insecurity (07/07/2022)   Hunger Vital Sign    Worried About Running Out of Food in the Last Year: Never true    Ran Out of Food in the Last Year: Never true  Transportation Needs: No Transportation Needs (07/07/2022)   PRAPARE - Administrator, Civil Service (Medical): No    Lack of Transportation (Non-Medical): No  Physical Activity: Insufficiently Active (07/07/2022)   Exercise Vital Sign    Days of Exercise per Week: 3 days    Minutes of Exercise per Session: 20 min  Stress: Stress Concern Present (07/07/2022)   [employer] of Occupational Health - Occupational Stress Questionnaire    Feeling of Stress : Very much  Social Connections: Socially Isolated (07/07/2022)   Social Connection and Isolation  Panel [NHANES]    Frequency of Communication with Friends and Family: Once a week    Frequency of Social Gatherings with Friends and Family: Never    Attends Religious Services: Never    Database administrator or Organizations: No    Attends Engineer, structural: Not on file    Marital Status: Never married  Intimate Partner Violence: Not on file    Review of Systems  All other systems reviewed and are negative.       Objective    BP 139/86   Pulse 89   Temp 98.1 F (36.7 C) (Oral)   Resp 16   Wt (!) 322 lb 9.6 oz (146.3 kg)   SpO2 97%   BMI 52.07 kg/m   Physical Exam Vitals and nursing note  reviewed.  Constitutional:      General: She is not in acute distress.    Appearance: She is obese.  Cardiovascular:     Rate and Rhythm: Normal rate and regular rhythm.  Pulmonary:     Effort: Pulmonary effort is normal.     Breath sounds: Normal breath sounds.  Abdominal:     Palpations: Abdomen is soft.     Tenderness: There is no abdominal tenderness.  Neurological:     General: No focal deficit present.     Mental Status: She is alert and oriented to person, place, and time.         Assessment & Plan:   1. Essential hypertension Appears stable without meds. Continue and monitor  2. Class 3 severe obesity due to excess calories with serious comorbidity and body mass index (BMI) of 50.0 to 59.9 in adult [facility]) Discussed dietary and activity options.   3. Anxiety state Appears stable. continue    Return in about 6 months (around 01/10/2023) for follow up, physical.   Tommie Raymond, MD

## 2022-07-18 ENCOUNTER — Encounter: Payer: Self-pay | Admitting: Family Medicine

## 2022-07-18 NOTE — Telephone Encounter (Signed)
Please advise patient.  

## 2022-07-22 IMAGING — DX DG CHEST 2V
2 series · 2 of 2 positions shown · non-contrast
Comparison: 12/15/2019

CLINICAL DATA: Wheezing, shortness of breath, cough, and nasal
congestion for 9 days

EXAM:
CHEST - 2 VIEW

[chest pa]
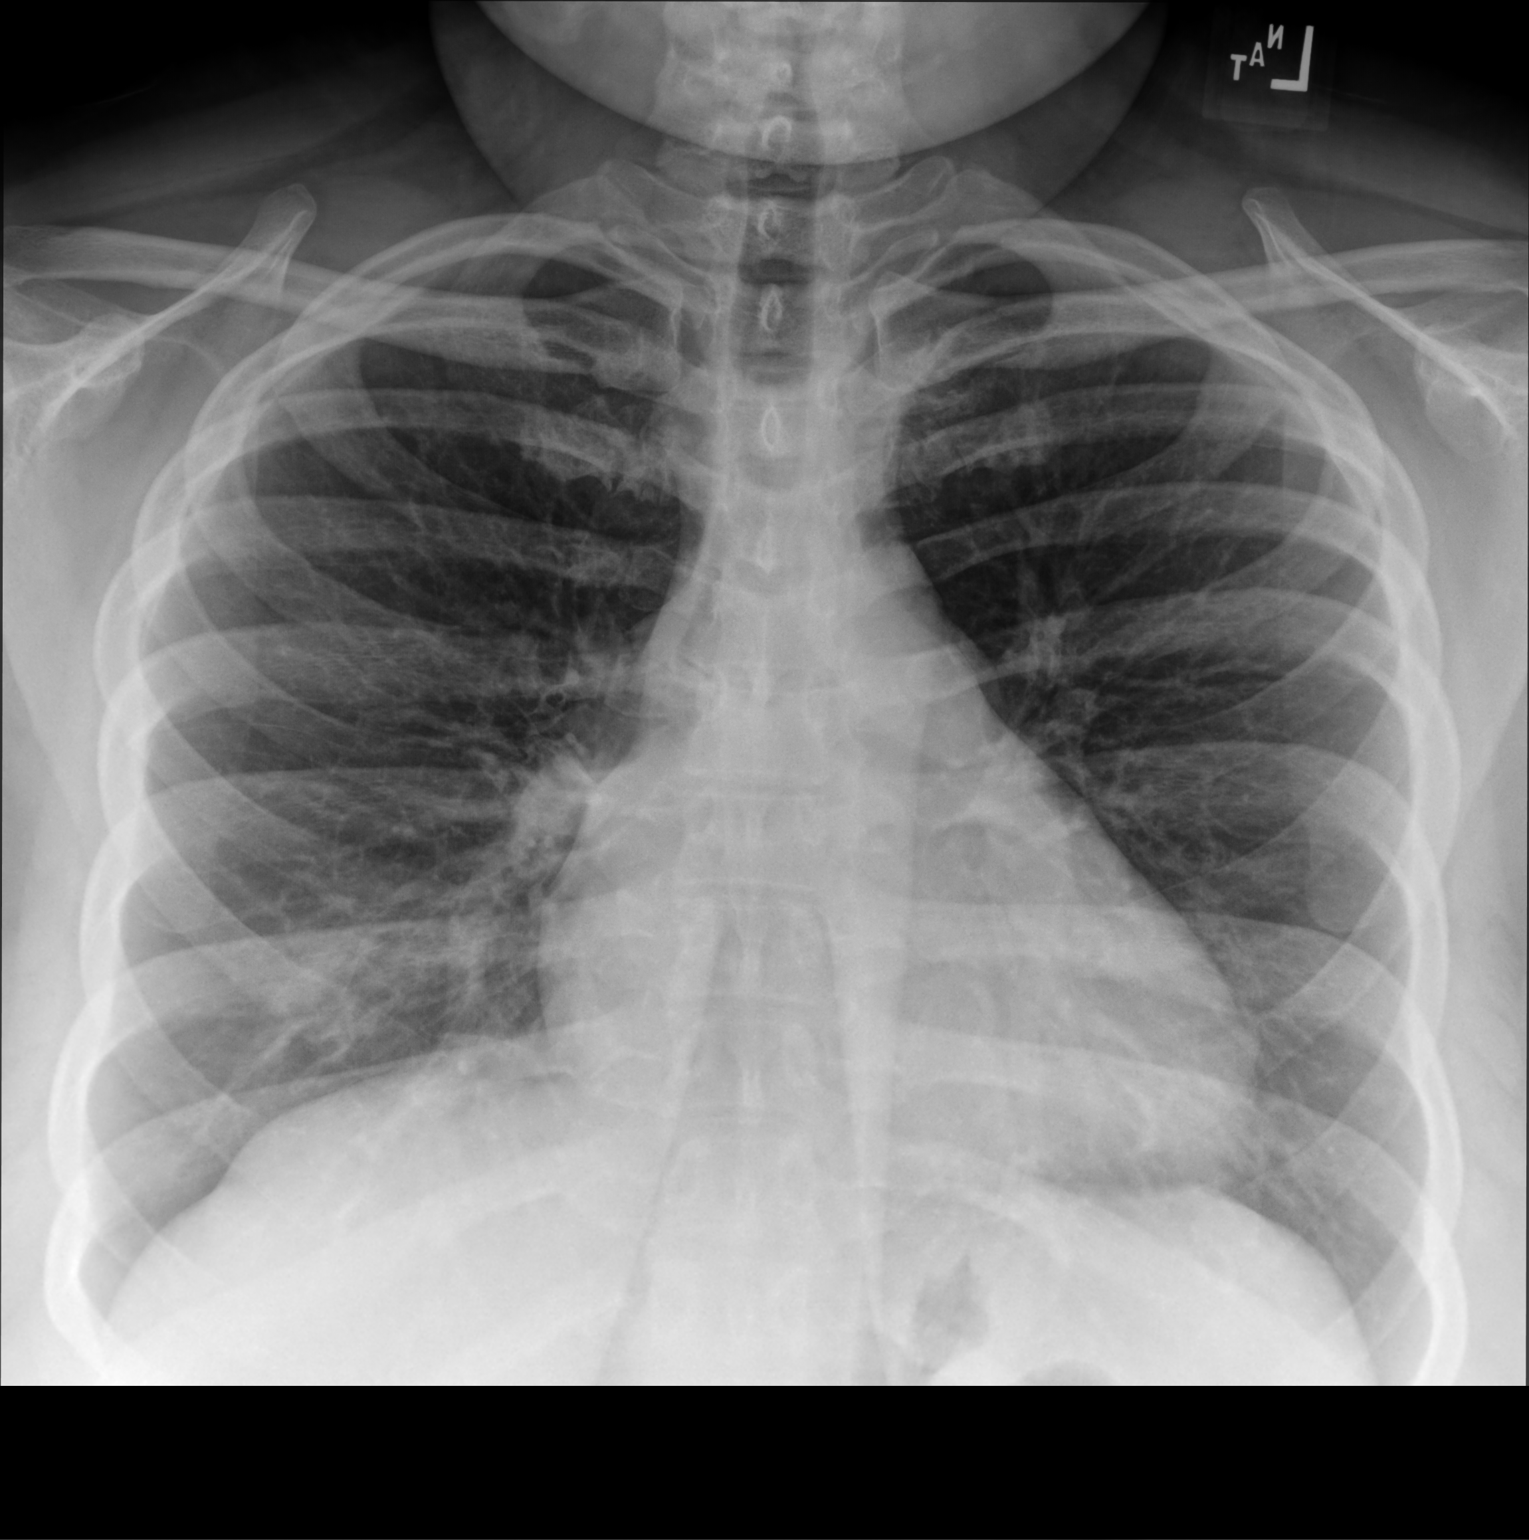

[chest lat]
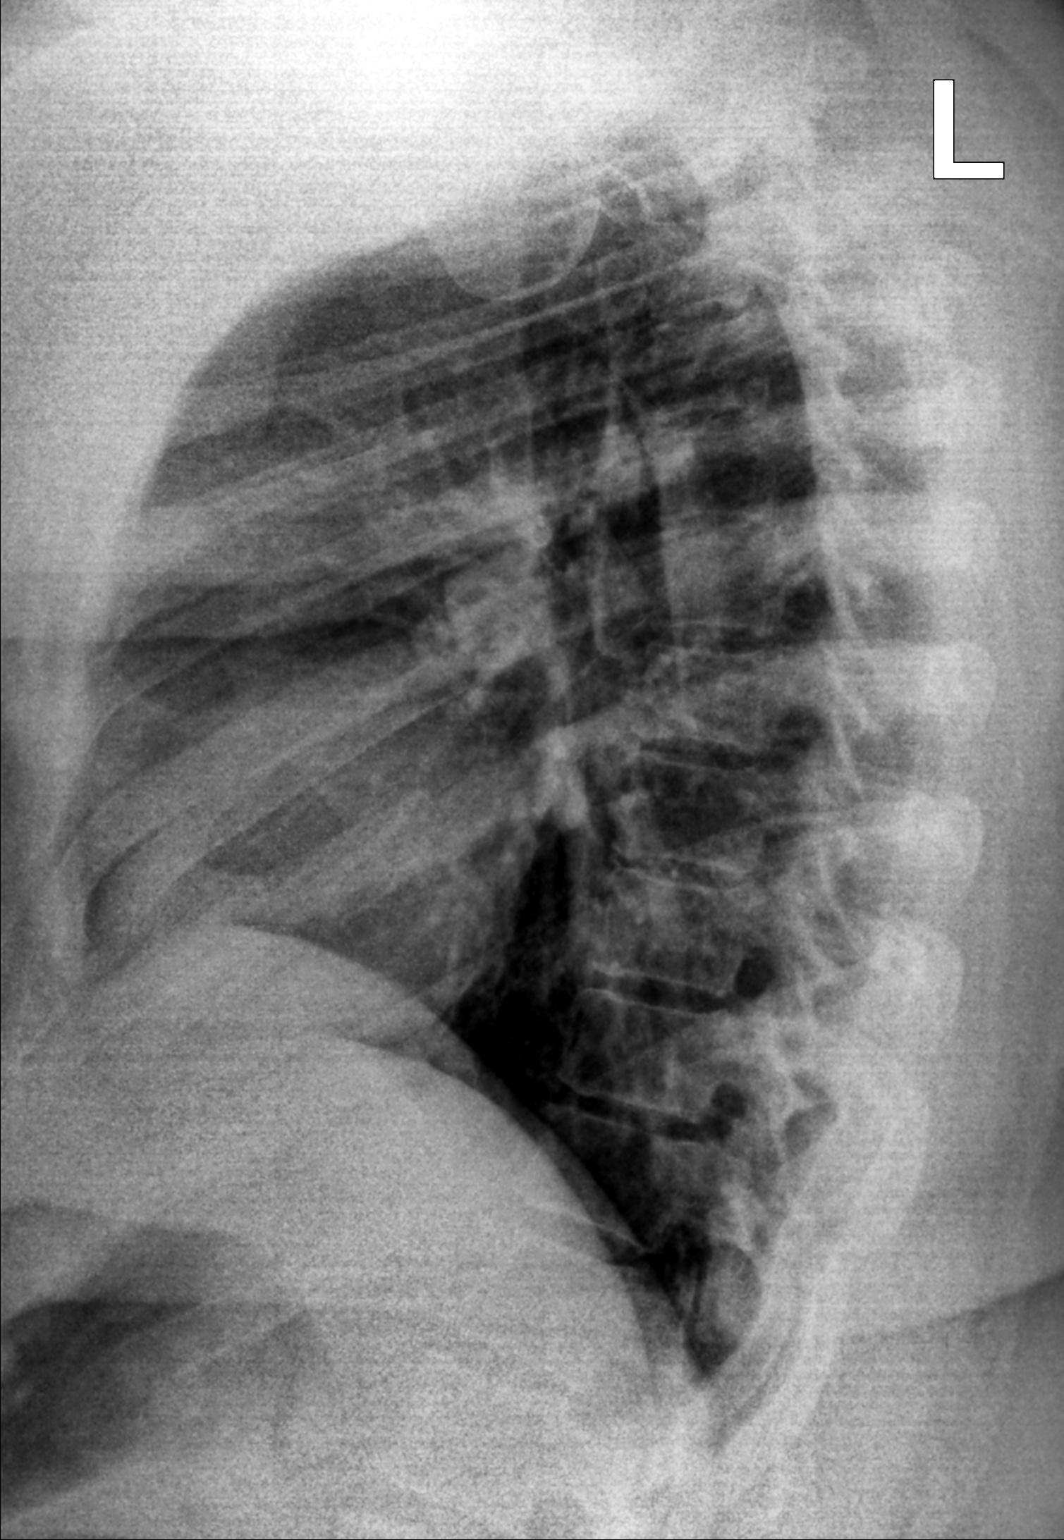

[2 of 2 positions shown; findings below may reference images not displayed]

FINDINGS: Upper normal heart size.

Mediastinal contours and pulmonary vascularity normal.

Minimal peribronchial thickening with mild infiltrate at RIGHT lung
base question pneumonia.

Remaining lungs clear.

No pleural effusion or pneumothorax.
IMPRESSION: Minimal bronchitic changes with mild RIGHT basilar infiltrate
consistent with pneumonia.

## 2022-08-05 LAB — HM PAP SMEAR: HPV, high-risk: POSITIVE

## 2022-08-16 ENCOUNTER — Encounter: Payer: Self-pay | Admitting: Family Medicine

## 2022-10-22 ENCOUNTER — Telehealth: Payer: BC Managed Care – PPO | Admitting: Family Medicine

## 2022-10-22 DIAGNOSIS — B9689 Other specified bacterial agents as the cause of diseases classified elsewhere: Secondary | ICD-10-CM

## 2022-10-22 DIAGNOSIS — N760 Acute vaginitis: Secondary | ICD-10-CM

## 2022-10-22 MED ORDER — METRONIDAZOLE 500 MG PO TABS: 500.0000 mg | ORAL_TABLET | Freq: Three times a day (TID) | ORAL | 0 refills | Status: AC

## 2022-10-22 NOTE — Progress Notes (Signed)
E-Visit for Vaginal Symptoms  We are sorry that you are not feeling well. Here is how we plan to help! Based on what you shared with me it looks like you: May have a vaginosis due to bacteria  Vaginosis is an inflammation of the vagina that can result in discharge, itching and pain. The cause is usually a change in the normal balance of vaginal bacteria or an infection. Vaginosis can also result from reduced estrogen levels after menopause.  The most common causes of vaginosis are:   Bacterial vaginosis which results from an overgrowth of one on several organisms that are normally present in your vagina.   Yeast infections which are caused by a naturally occurring fungus called candida.   Vaginal atrophy (atrophic vaginosis) which results from the thinning of the vagina from reduced estrogen levels after menopause.   Trichomoniasis which is caused by a parasite and is commonly transmitted by sexual intercourse.  Factors that increase your risk of developing vaginosis include: Medications, such as antibiotics and steroids Uncontrolled diabetes Use of hygiene products such as bubble bath, vaginal spray or vaginal deodorant Douching Wearing damp or tight-fitting clothing Using an intrauterine device (IUD) for birth control Hormonal changes, such as those associated with pregnancy, birth control pills or menopause Sexual activity Having a sexually transmitted infection  Your treatment plan is Metronidazole or Flagyl 500mg twice a day for 7 days.  I have electronically sent this prescription into the pharmacy that you have chosen.  Be sure to take all of the medication as directed. Stop taking any medication if you develop a rash, tongue swelling or shortness of breath. Mothers who are breast feeding should consider pumping and discarding their breast milk while on these antibiotics. However, there is no consensus that infant exposure at these doses would be harmful.  Remember that  medication creams can weaken latex condoms. .   HOME CARE:  Good hygiene may prevent some types of vaginosis from recurring and may relieve some symptoms:  Avoid baths, hot tubs and whirlpool spas. Rinse soap from your outer genital area after a shower, and dry the area well to prevent irritation. Don't use scented or harsh soaps, such as those with deodorant or antibacterial action. Avoid irritants. These include scented tampons and pads. Wipe from front to back after using the toilet. Doing so avoids spreading fecal bacteria to your vagina.  Other things that may help prevent vaginosis include:  Don't douche. Your vagina doesn't require cleansing other than normal bathing. Repetitive douching disrupts the normal organisms that reside in the vagina and can actually increase your risk of vaginal infection. Douching won't clear up a vaginal infection. Use a latex condom. Both female and female latex condoms may help you avoid infections spread by sexual contact. Wear cotton underwear. Also wear pantyhose with a cotton crotch. If you feel comfortable without it, skip wearing underwear to bed. Yeast thrives in moist environments Your symptoms should improve in the next day or two.  GET HELP RIGHT AWAY IF:  You have pain in your lower abdomen ( pelvic area or over your ovaries) You develop nausea or vomiting You develop a fever Your discharge changes or worsens You have persistent pain with intercourse You develop shortness of breath, a rapid pulse, or you faint.  These symptoms could be signs of problems or infections that need to be evaluated by a medical provider now.  MAKE SURE YOU   Understand these instructions. Will watch your condition. Will get help right   away if you are not doing well or get worse.  Thank you for choosing an e-visit.  Your e-visit answers were reviewed by a board certified advanced clinical practitioner to complete your personal care plan. Depending upon the  condition, your plan could have included both over the counter or prescription medications.  Please review your pharmacy choice. Make sure the pharmacy is open so you can pick up prescription now. If there is a problem, you may contact your provider through MyChart messaging and have the prescription routed to another pharmacy.  Your safety is important to us. If you have drug allergies check your prescription carefully.   For the next 24 hours you can use MyChart to ask questions about today's visit, request a non-urgent call back, or ask for a work or school excuse. You will get an email in the next two days asking about your experience. I hope that your e-visit has been valuable and will speed your recovery.    have provided 5 minutes of non face to face time during this encounter for chart review and documentation.   

## 2023-01-10 ENCOUNTER — Ambulatory Visit: Payer: BC Managed Care – PPO | Admitting: Family Medicine

## 2023-01-17 ENCOUNTER — Ambulatory Visit (INDEPENDENT_AMBULATORY_CARE_PROVIDER_SITE_OTHER): Payer: BC Managed Care – PPO | Admitting: Family Medicine

## 2023-01-17 VITALS — BP 146/90 | HR 89 | Temp 98.7°F | Resp 18 | Ht 66.0 in | Wt 315.0 lb

## 2023-01-17 DIAGNOSIS — F32A Depression, unspecified: Secondary | ICD-10-CM | POA: Diagnosis not present

## 2023-01-17 DIAGNOSIS — Z6841 Body Mass Index (BMI) 40.0 and over, adult: Secondary | ICD-10-CM

## 2023-01-17 DIAGNOSIS — F419 Anxiety disorder, unspecified: Secondary | ICD-10-CM

## 2023-01-17 DIAGNOSIS — I10 Essential (primary) hypertension: Secondary | ICD-10-CM

## 2023-01-17 DIAGNOSIS — Z23 Encounter for immunization: Secondary | ICD-10-CM | POA: Diagnosis not present

## 2023-01-17 DIAGNOSIS — E66813 Obesity, class 3: Secondary | ICD-10-CM

## 2023-01-17 MED ORDER — SERTRALINE HCL 25 MG PO TABS: 25.0000 mg | ORAL_TABLET | Freq: Every day | ORAL | 0 refills | Status: DC

## 2023-01-17 MED ORDER — LOSARTAN POTASSIUM-HCTZ 50-12.5 MG PO TABS: 1.0000 | ORAL_TABLET | Freq: Every day | ORAL | 0 refills | Status: DC

## 2023-01-18 ENCOUNTER — Encounter: Payer: Self-pay | Admitting: Family Medicine

## 2023-01-18 NOTE — Progress Notes (Signed)
Established Patient Office Visit  Subjective    Patient ID: Melody Gates, female    DOB: Sep 29, 1990  Age: 32 y.o. MRN: [id number]  CC:  Chief Complaint  Patient presents with   Follow-up    HPI Melody Gates presents for follow up of hypertension. She has not been taking meds as recommended. She also reports increased social stressors.,   Outpatient Encounter Medications as of 01/17/2023  Medication Sig   losartan-hydrochlorothiazide (HYZAAR) 50-12.5 MG tablet Take 1 tablet by mouth daily.   sertraline (ZOLOFT) 25 MG tablet Take 1 tablet (25 mg total) by mouth daily.   Cholecalciferol 1.25 MG (50000 UT) capsule    No facility-administered encounter medications on file as of 01/17/2023.    Past Medical History:  Diagnosis Date   Anemia    Anxiety    Depression    GERD (gastroesophageal reflux disease)    Hypertension     History reviewed. No pertinent surgical history.  Family History  Problem Relation Age of Onset   Hypertension Mother    Diabetes Maternal Grandmother    Stroke Maternal Grandmother    Asthma Sister    Asthma Brother    Diabetes Maternal Uncle    Hypertension Brother    Hypertension Brother     Social History   Socioeconomic History   Marital status: Single    Spouse name: Not on file   Number of children: Not on file   Years of education: Not on file   Highest education level: Some college, no degree  Occupational History   Not on file  Tobacco Use   Smoking status: Former   Smokeless tobacco: Never   Tobacco comments:    Only lasted a few months  Vaping Use   Vaping status: Never Used  Substance and Sexual Activity   Alcohol use: Never   Drug use: Never   Sexual activity: Yes    Birth control/protection: Other-see comments    Comment: Provera when needed for PCOS  Other Topics Concern   Not on file  Social History Narrative   Not on file   Social Determinants of Health   Financial Resource Strain: Low Risk  (01/17/2023)    Overall Financial Resource Strain (CARDIA)    Difficulty of Paying Living Expenses: Not very hard  Food Insecurity: No Food Insecurity (01/17/2023)   Hunger Vital Sign    Worried About Running Out of Food in the Last Year: Never true    Ran Out of Food in the Last Year: Never true  Transportation Needs: No Transportation Needs (01/17/2023)   PRAPARE - Administrator, Civil Service (Medical): No    Lack of Transportation (Non-Medical): No  Physical Activity: Insufficiently Active (01/17/2023)   Exercise Vital Sign    Days of Exercise per Week: 4 days    Minutes of Exercise per Session: 20 min  Stress: Stress Concern Present (01/17/2023)   [employer] of Occupational Health - Occupational Stress Questionnaire    Feeling of Stress : Very much  Social Connections: Socially Isolated (01/17/2023)   Social Connection and Isolation Panel [NHANES]    Frequency of Communication with Friends and Family: More than three times a week    Frequency of Social Gatherings with Friends and Family: Never    Attends Religious Services: Never    Database administrator or Organizations: No    Attends Engineer, structural: Not on file    Marital Status: Never married  Intimate Partner Violence:  Not on file    Review of Systems  All other systems reviewed and are negative.       Objective    BP (!) 146/90 (BP Location: Right Arm, Patient Position: Sitting, Cuff Size: Large)   Pulse 89   Temp 98.7 F (37.1 C) (Oral)   Resp 18   Ht 5\' 6"  (1.676 m)   Wt (!) 315 lb (142.9 kg)   SpO2 98%   BMI 50.84 kg/m   Physical Exam Vitals and nursing note reviewed.  Constitutional:      General: She is not in acute distress.    Appearance: She is obese.  Cardiovascular:     Rate and Rhythm: Normal rate and regular rhythm.  Pulmonary:     Effort: Pulmonary effort is normal.     Breath sounds: Normal breath sounds.  Abdominal:     Palpations: Abdomen is soft.     Tenderness:  There is no abdominal tenderness.  Neurological:     General: No focal deficit present.     Mental Status: She is alert and oriented to person, place, and time.         Assessment & Plan:  1. Essential hypertension Elevated readings. Discussed compliance. Losartan- hydrochlorothiazide 50-12.5 prescribed.   2. Anxiety and depression Patient agreed to start zoloft 25 mg daily.   3. Class 3 severe obesity due to excess calories with serious comorbidity and body mass index (BMI) of 50.0 to 59.9 in adult (HCC)   4. Immunization due  - Tdap vaccine greater than or equal to 7yo IM    Return in about 4 weeks (around 02/14/2023) for follow up.   Tommie Raymond, MD

## 2023-02-09 ENCOUNTER — Other Ambulatory Visit: Payer: Self-pay | Admitting: Family Medicine

## 2023-03-13 ENCOUNTER — Encounter: Payer: Self-pay | Admitting: Oncology

## 2023-04-06 ENCOUNTER — Other Ambulatory Visit: Payer: Self-pay | Admitting: Family Medicine

## 2023-04-06 DIAGNOSIS — Z1231 Encounter for screening mammogram for malignant neoplasm of breast: Secondary | ICD-10-CM

## 2023-04-14 ENCOUNTER — Encounter: Payer: Self-pay | Admitting: Oncology

## 2023-04-15 ENCOUNTER — Other Ambulatory Visit: Payer: Self-pay | Admitting: Family Medicine

## 2023-04-17 DIAGNOSIS — Z1231 Encounter for screening mammogram for malignant neoplasm of breast: Secondary | ICD-10-CM

## 2023-04-19 ENCOUNTER — Ambulatory Visit
Admission: RE | Admit: 2023-04-19 | Discharge: 2023-04-19 | Disposition: A | Discharge status: Home / Self Care | Payer: 59 | Source: Ambulatory Visit | Attending: Family Medicine | Admitting: Family Medicine

## 2023-04-19 DIAGNOSIS — Z1231 Encounter for screening mammogram for malignant neoplasm of breast: Secondary | ICD-10-CM

## 2023-05-03 ENCOUNTER — Ambulatory Visit (INDEPENDENT_AMBULATORY_CARE_PROVIDER_SITE_OTHER): Payer: 59 | Admitting: Family Medicine

## 2023-05-03 ENCOUNTER — Encounter: Payer: Self-pay | Admitting: Family Medicine

## 2023-05-03 VITALS — BP 164/107 | HR 90 | Temp 99.4°F | Resp 18 | Ht 66.0 in | Wt 314.8 lb

## 2023-05-03 DIAGNOSIS — Z7689 Persons encountering health services in other specified circumstances: Secondary | ICD-10-CM

## 2023-05-03 DIAGNOSIS — Z6841 Body Mass Index (BMI) 40.0 and over, adult: Secondary | ICD-10-CM

## 2023-05-03 DIAGNOSIS — E66813 Obesity, class 3: Secondary | ICD-10-CM | POA: Diagnosis not present

## 2023-05-03 DIAGNOSIS — I10 Essential (primary) hypertension: Secondary | ICD-10-CM

## 2023-05-03 MED ORDER — WEGOVY 0.25 MG/0.5ML ~~LOC~~ SOAJ: 0.2500 mg | SUBCUTANEOUS | 0 refills | Status: DC

## 2023-05-03 MED ORDER — LOSARTAN POTASSIUM-HCTZ 100-25 MG PO TABS: 1.0000 | ORAL_TABLET | Freq: Every day | ORAL | 0 refills | Status: DC

## 2023-05-04 ENCOUNTER — Encounter: Payer: Self-pay | Admitting: Family Medicine

## 2023-05-04 NOTE — Progress Notes (Signed)
Established Patient Office Visit  Subjective    Patient ID: Melody Gates, female    DOB: [birth year]  Age: 33 y.o. MRN: [id number]  CC:  Chief Complaint  Patient presents with   possible weight loss surgery    Referral, lab work,    HPI Melody Gates presents for routine follow up of hypertension. She also would like to discuss options for weight loss.   Outpatient Encounter Medications as of 05/03/2023  Medication Sig   losartan-hydrochlorothiazide (HYZAAR) 100-25 MG tablet Take 1 tablet by mouth daily.   Semaglutide-Weight Management (WEGOVY) 0.25 MG/0.5ML SOAJ Inject 0.25 mg into the skin once a week.   [DISCONTINUED] losartan-hydrochlorothiazide (HYZAAR) 50-12.5 MG tablet TAKE 1 TABLET BY MOUTH EVERY DAY   sertraline (ZOLOFT) 25 MG tablet Take 1 tablet (25 mg total) by mouth daily. (Patient not taking: Reported on 05/03/2023)   No facility-administered encounter medications on file as of 05/03/2023.    Past Medical History:  Diagnosis Date   Anemia    Anxiety    Depression    GERD (gastroesophageal reflux disease)    Hypertension     History reviewed. No pertinent surgical history.  Family History  Problem Relation Age of Onset   Breast cancer Mother 36   Hypertension Mother    Asthma Sister    Diabetes Maternal Uncle    Diabetes Maternal Grandmother    Stroke Maternal Grandmother    Asthma Brother    Hypertension Brother    Hypertension Brother     Social History   Socioeconomic History   Marital status: Single    Spouse name: Not on file   Number of children: Not on file   Years of education: Not on file   Highest education level: 12th grade  Occupational History   Not on file  Tobacco Use   Smoking status: Former   Smokeless tobacco: Never   Tobacco comments:    Only lasted a few months  Vaping Use   Vaping status: Never Used  Substance and Sexual Activity   Alcohol use: Never   Drug use: Never   Sexual activity: Yes    Birth  control/protection: Other-see comments    Comment: Provera when needed for PCOS  Other Topics Concern   Not on file  Social History Narrative   Not on file   Social Drivers of Health   Financial Resource Strain: Low Risk  (05/01/2023)   Overall Financial Resource Strain (CARDIA)    Difficulty of Paying Living Expenses: Not very hard  Food Insecurity: No Food Insecurity (05/01/2023)   Hunger Vital Sign    Worried About Running Out of Food in the Last Year: Never true    Ran Out of Food in the Last Year: Never true  Transportation Needs: No Transportation Needs (05/01/2023)   PRAPARE - Administrator, Civil Service (Medical): No    Lack of Transportation (Non-Medical): No  Physical Activity: Insufficiently Active (05/01/2023)   Exercise Vital Sign    Days of Exercise per Week: 3 days    Minutes of Exercise per Session: 20 min  Stress: No Stress Concern Present (05/01/2023)   [employer] of Occupational Health - Occupational Stress Questionnaire    Feeling of Stress : Only a little  Social Connections: Socially Isolated (05/01/2023)   Social Connection and Isolation Panel [NHANES]    Frequency of Communication with Friends and Family: Three times a week    Frequency of Social Gatherings with Friends and Family: Never  Attends Religious Services: Never    Active Member of Clubs or Organizations: No    Attends Engineer, structural: Not on file    Marital Status: Never married  Intimate Partner Violence: Not on file    Review of Systems  All other systems reviewed and are negative.       Objective    BP (!) 164/107 (BP Location: Right Arm, Patient Position: Sitting, Cuff Size: Normal)   Pulse 90   Temp 99.4 F (37.4 C) (Oral)   Resp 18   Ht 5\' 6"  (1.676 m)   Wt (!) 314 lb 12.8 oz (142.8 kg)   SpO2 98%   BMI 50.81 kg/m   Physical Exam Vitals and nursing note reviewed.  Constitutional:      General: She is not in acute distress.     Appearance: She is obese.  Cardiovascular:     Rate and Rhythm: Normal rate and regular rhythm.  Pulmonary:     Effort: Pulmonary effort is normal.     Breath sounds: Normal breath sounds.  Abdominal:     Palpations: Abdomen is soft.     Tenderness: There is no abdominal tenderness.  Neurological:     General: No focal deficit present.     Mental Status: She is alert and oriented to person, place, and time.         Assessment & Plan:   Uncontrolled hypertension  Class 3 severe obesity due to excess calories with serious comorbidity and body mass index (BMI) of 50.0 to 59.9 in adult [facility])  Other orders -     Losartan Potassium-HCTZ; Take 1 tablet by mouth daily.  Dispense: 90 tablet; Refill: 0 -     Wegovy; Inject 0.25 mg into the skin once a week.  Dispense: 2 mL; Refill: 0     Return in about 2 weeks (around 05/17/2023) for follow up, chronic med issues.   Tommie Raymond, MD

## 2023-05-13 ENCOUNTER — Encounter: Payer: Self-pay | Admitting: Oncology

## 2023-05-17 ENCOUNTER — Ambulatory Visit: Payer: 59 | Admitting: Family Medicine

## 2023-06-14 ENCOUNTER — Ambulatory Visit: Admitting: Family Medicine

## 2023-06-19 ENCOUNTER — Telehealth: Admitting: Physician Assistant

## 2023-06-19 DIAGNOSIS — J029 Acute pharyngitis, unspecified: Secondary | ICD-10-CM

## 2023-06-19 NOTE — Progress Notes (Signed)
 E-Visit for Sore Throat  We are sorry that you are not feeling well.  Here is how we plan to help!  Your symptoms indicate a likely viral infection (Pharyngitis).   Pharyngitis is inflammation in the back of the throat which can cause a sore throat, scratchiness and sometimes difficulty swallowing.   Pharyngitis is typically caused by a respiratory virus and will just run its course.  Please keep in mind that your symptoms could last up to 10 days.  For throat pain, we recommend over the counter oral pain relief medications such as acetaminophen or aspirin, or anti-inflammatory medications such as ibuprofen or naproxen sodium.  Topical treatments such as oral throat lozenges or sprays may be used as needed.  Avoid close contact with loved ones, especially the very young and elderly.  Remember to wash your hands thoroughly throughout the day as this is the number one way to prevent the spread of infection and wipe down door knobs and counters with disinfectant.  After careful review of your answers, I would not recommend an antibiotic for your condition.  Antibiotics should not be used to treat conditions that we suspect are caused by viruses like the virus that causes the common cold or flu. However, some people can have Strep with atypical symptoms. You may need formal testing in clinic or office to confirm if your symptoms continue or worsen.  Providers prescribe antibiotics to treat infections caused by bacteria. Antibiotics are very powerful in treating bacterial infections when they are used properly.  To maintain their effectiveness, they should be used only when necessary.  Overuse of antibiotics has resulted in the development of super bugs that are resistant to treatment!    Home Care: Only take medications as instructed by your medical team. Do not drink alcohol while taking these medications. A steam or ultrasonic humidifier can help congestion.  You can place a towel over your head and  breathe in the steam from hot water coming from a faucet. Avoid close contacts especially the very young and the elderly. Cover your mouth when you cough or sneeze. Always remember to wash your hands.  Get Help Right Away If: You develop worsening fever or throat pain. You develop a severe head ache or visual changes. Your symptoms persist after you have completed your treatment plan.  Make sure you Understand these instructions. Will watch your condition. Will get help right away if you are not doing well or get worse.   Thank you for choosing an e-visit.  Your e-visit answers were reviewed by a board certified advanced clinical practitioner to complete your personal care plan. Depending upon the condition, your plan could have included both over the counter or prescription medications.  Please review your pharmacy choice. Make sure the pharmacy is open so you can pick up prescription now. If there is a problem, you may contact your provider through [employer] and have the prescription routed to another pharmacy.  Your safety is important to [place]. If you have drug allergies check your prescription carefully.   For the next 24 hours you can use MyChart to ask questions about today's visit, request a non-urgent call back, or ask for a work or school excuse. You will get an email in the next two days asking about your experience. I hope that your e-visit has been valuable and will speed your recovery.  I have spent 5 minutes in review of e-visit questionnaire, review and updating patient chart, medical decision making and response  to patient.   Margaretann Loveless, PA-C

## 2023-07-12 ENCOUNTER — Encounter: Payer: Self-pay | Admitting: Oncology

## 2023-08-03 ENCOUNTER — Other Ambulatory Visit: Payer: Self-pay | Admitting: Family Medicine

## 2023-08-08 LAB — HM PAP SMEAR

## 2023-08-11 ENCOUNTER — Telehealth: Admitting: Family Medicine

## 2023-08-11 ENCOUNTER — Encounter: Payer: Self-pay | Admitting: Oncology

## 2023-08-11 DIAGNOSIS — J019 Acute sinusitis, unspecified: Secondary | ICD-10-CM | POA: Diagnosis not present

## 2023-08-11 DIAGNOSIS — B9689 Other specified bacterial agents as the cause of diseases classified elsewhere: Secondary | ICD-10-CM | POA: Diagnosis not present

## 2023-08-11 MED ORDER — DOXYCYCLINE HYCLATE 100 MG PO TABS: 100.0000 mg | ORAL_TABLET | Freq: Two times a day (BID) | ORAL | 0 refills | Status: AC

## 2023-08-11 NOTE — Progress Notes (Signed)

## 2023-09-05 ENCOUNTER — Encounter

## 2023-10-02 ENCOUNTER — Ambulatory Visit: Payer: Self-pay

## 2023-10-02 NOTE — Telephone Encounter (Signed)
 Pt wants her hemoglobin and iron  levels checked. States she is anemic and has been on her cycle for 3 months. Feeling dizzy, getting headaches, and having shortness of breath.   Copied from CRM [id number]. Topic: Clinical - Lab/Test Results >> Oct 02, 2023  7:47 AM Melody Gates wrote: Reason for CRM: Patient would like labs ordered.. she isn't feeling well

## 2023-10-02 NOTE — Telephone Encounter (Signed)
 Patient has been  schedule for 10/03/2023

## 2023-10-02 NOTE — Telephone Encounter (Signed)
 FYI Only or Action Required?: FYI only for provider.  Patient was last seen in primary care on 05/03/2023 by Tanda Bleacher, MD.  Called Nurse Triage reporting Menstrual Problem and Headache.  Symptoms began several months ago.  Interventions attempted: Prescription medications: hormones.  Symptoms are: unchanged.  Triage Disposition: See PCP When Office is Open (Within 3 Days)  Patient/caregiver understands and will follow disposition?: Yes   Message from [facility] sent at 10/02/2023  7:51 AM EDT   would like to be seen isn't feeling: heavy bleeding. Headaches.. the birth control hasn't helped.. OB isn't helping her when she is seen.  ( I put in req to order labs ).. worsening   Reason for Disposition  [1] Heavier than normal periods (e.g., doubling up on pads to prevent leaking, needing to change pads overnight, unable to do normal activities) AND [2] last > 7 days  Answer Assessment - Initial Assessment Questions 1. BLEEDING SEVERITY: Describe the bleeding that you are having. How much bleeding is there?      Heavy 2. ONSET: When did the bleeding begin? Is it continuing now?     Ongoing issue for 3 months 3. MENSTRUAL PERIOD: When was the last normal menstrual period? How is this different than your period?     Over 3 months ago 4. REGULARITY: How regular are your periods?     Not regular 5. HORMONE MEDICINES: Are you taking any hormone medicines, prescription or over-the-counter? (e.g., birth control pills, estrogen)     BCP  6. CAUSE: What do you think is causing the bleeding? (e.g., recent gyn surgery, recent gyn procedure; known bleeding disorder, cervical cancer, polycystic ovarian disease, fibroids)          7. HEMODYNAMIC STATUS: Are you weak or feeling lightheaded? If Yes, ask: Can you stand and walk normally?        History of anemia-occasional shortness of breath she is starting to feel this.  8. OTHER SYMPTOMS: What other symptoms are you  having with the bleeding? (e.g., passed tissue, vaginal discharge, fever, menstrual-type cramps)    Dizziness intermittent, headache, elevated blood pressure with on hormone          Additional info: 1) OB/Gyn put her on different hormone medications to help control bleeding but none were helping. She notices her blood pressure increases on hormone medication, had one reading >200 ob/gyn switched medication at that time. Was advised to follow up with PCP. 2) Requesting labs  Protocols used: Vaginal Bleeding - Abnormal-A-AH

## 2023-10-03 ENCOUNTER — Ambulatory Visit: Admitting: Family Medicine

## 2023-10-03 VITALS — BP 147/97 | HR 85 | Ht 66.0 in | Wt 310.4 lb

## 2023-10-03 DIAGNOSIS — N939 Abnormal uterine and vaginal bleeding, unspecified: Secondary | ICD-10-CM

## 2023-10-03 DIAGNOSIS — E66813 Obesity, class 3: Secondary | ICD-10-CM | POA: Diagnosis not present

## 2023-10-03 DIAGNOSIS — I10 Essential (primary) hypertension: Secondary | ICD-10-CM | POA: Diagnosis not present

## 2023-10-03 DIAGNOSIS — Z6841 Body Mass Index (BMI) 40.0 and over, adult: Secondary | ICD-10-CM

## 2023-10-04 ENCOUNTER — Encounter: Payer: Self-pay | Admitting: Family Medicine

## 2023-10-04 LAB — CBC WITH DIFFERENTIAL/PLATELET
Basophils Absolute: 0.1 x10E3/uL (ref 0.0–0.2)
Basos: 1 %
EOS (ABSOLUTE): 0.2 x10E3/uL (ref 0.0–0.4)
Eos: 2 %
Hematocrit: 41 % (ref 34.0–46.6)
Hemoglobin: 11.6 g/dL (ref 11.1–15.9)
Immature Grans (Abs): 0 x10E3/uL (ref 0.0–0.1)
Immature Granulocytes: 0 %
Lymphocytes Absolute: 4 x10E3/uL — ABNORMAL HIGH (ref 0.7–3.1)
Lymphs: 27 %
MCH: 21.9 pg — ABNORMAL LOW (ref 26.6–33.0)
MCHC: 28.3 g/dL — ABNORMAL LOW (ref 31.5–35.7)
MCV: 78 fL — ABNORMAL LOW (ref 79–97)
Monocytes Absolute: 0.9 x10E3/uL (ref 0.1–0.9)
Monocytes: 6 %
Neutrophils Absolute: 9.7 x10E3/uL — ABNORMAL HIGH (ref 1.4–7.0)
Neutrophils: 64 %
Platelets: 481 x10E3/uL — ABNORMAL HIGH (ref 150–450)
RBC: 5.29 x10E6/uL — ABNORMAL HIGH (ref 3.77–5.28)
RDW: 18.2 % — ABNORMAL HIGH (ref 11.7–15.4)
WBC: 14.9 x10E3/uL — ABNORMAL HIGH (ref 3.4–10.8)

## 2023-10-04 LAB — TSH: TSH: 3.14 u[IU]/mL (ref 0.450–4.500)

## 2023-10-04 LAB — BASIC METABOLIC PANEL WITH GFR
BUN/Creatinine Ratio: 14 (ref 9–23)
BUN: 10 mg/dL (ref 6–20)
CO2: 20 mmol/L (ref 20–29)
Calcium: 9.9 mg/dL (ref 8.7–10.2)
Chloride: 100 mmol/L (ref 96–106)
Creatinine, Ser: 0.73 mg/dL (ref 0.57–1.00)
Glucose: 77 mg/dL (ref 70–99)
Potassium: 4.9 mmol/L (ref 3.5–5.2)
Sodium: 136 mmol/L (ref 134–144)
eGFR: 110 mL/min/1.73 (ref >59)

## 2023-10-04 NOTE — Progress Notes (Signed)
 Established Patient Office Visit  Subjective    Patient ID: Melody Gates, female    DOB: [birth year]  Age: 33 y.o. MRN: [id number]  CC:  Chief Complaint  Patient presents with   Menorrhagia    Pt says she has been on her cycle for 74 days. She has had headaches for about a week.      HPI Melody Gates presents for complaint of vaginal bleeding for about 70+ days. She reports that she has tried different agents that have not been helpful. She desires to be referred to a different consultant for further eval/mgt  Outpatient Encounter Medications as of 10/03/2023  Medication Sig   losartan -hydrochlorothiazide (HYZAAR) 100-25 MG tablet TAKE 1 TABLET BY MOUTH EVERY DAY   sertraline  (ZOLOFT ) 25 MG tablet Take 1 tablet (25 mg total) by mouth daily. (Patient not taking: Reported on 10/03/2023)   No facility-administered encounter medications on file as of 10/03/2023.    Past Medical History:  Diagnosis Date   Anemia    Anxiety    Depression    GERD (gastroesophageal reflux disease)    Hypertension     No past surgical history on file.  Family History  Problem Relation Age of Onset   Breast cancer Mother 70   Hypertension Mother    Asthma Sister    Diabetes Maternal Uncle    Diabetes Maternal Grandmother    Stroke Maternal Grandmother    Asthma Brother    Hypertension Brother    Hypertension Brother     Social History   Socioeconomic History   Marital status: Single    Spouse name: Not on file   Number of children: Not on file   Years of education: Not on file   Highest education level: Some college, no degree  Occupational History   Not on file  Tobacco Use   Smoking status: Former   Smokeless tobacco: Never   Tobacco comments:    Only lasted a few months  Vaping Use   Vaping status: Never Used  Substance and Sexual Activity   Alcohol use: Never   Drug use: Never   Sexual activity: Yes    Birth control/protection: Other-see comments    Comment: Provera  when needed for PCOS  Other Topics Concern   Not on file  Social History Narrative   Not on file   Social Drivers of Health   Financial Resource Strain: Low Risk  (10/02/2023)   Overall Financial Resource Strain (CARDIA)    Difficulty of Paying Living Expenses: Not very hard  Food Insecurity: No Food Insecurity (10/02/2023)   Hunger Vital Sign    Worried About Running Out of Food in the Last Year: Never true    Ran Out of Food in the Last Year: Never true  Transportation Needs: No Transportation Needs (10/02/2023)   PRAPARE - Administrator, Civil Service (Medical): No    Lack of Transportation (Non-Medical): No  Physical Activity: Insufficiently Active (10/02/2023)   Exercise Vital Sign    Days of Exercise per Week: 2 days    Minutes of Exercise per Session: 60 min  Stress: Stress Concern Present (10/02/2023)   [employer] of Occupational Health - Occupational Stress Questionnaire    Feeling of Stress: To some extent  Social Connections: Socially Isolated (10/02/2023)   Social Connection and Isolation Panel    Frequency of Communication with Friends and Family: More than three times a week    Frequency of Social Gatherings with Friends and  Family: Never    Attends Religious Services: Never    Database administrator or Organizations: No    Attends Engineer, structural: Not on file    Marital Status: Never married  Intimate Partner Violence: Not on file    Review of Systems  All other systems reviewed and are negative.       Objective    BP (!) 147/97   Pulse 85   Ht 5' 6 (1.676 m)   Wt (!) 310 lb 6.4 oz (140.8 kg)   LMP 10/03/2023 (Exact Date)   SpO2 98%   BMI 50.10 kg/m   Physical Exam Vitals and nursing note reviewed.  Constitutional:      General: She is not in acute distress.    Appearance: She is obese.  Cardiovascular:     Rate and Rhythm: Normal rate and regular rhythm.  Pulmonary:     Effort: Pulmonary effort is normal.      Breath sounds: Normal breath sounds.  Abdominal:     Palpations: Abdomen is soft.     Tenderness: There is no abdominal tenderness.  Neurological:     General: No focal deficit present.     Mental Status: She is alert and oriented to person, place, and time.         Assessment & Plan:   Abnormal uterine bleeding -     Ambulatory referral to Gynecology -     CBC with Differential/Platelet -     Basic metabolic panel with GFR -     TSH  Essential hypertension  Class 3 severe obesity due to excess calories with serious comorbidity and body mass index (BMI) of 50.0 to 59.9 in adult     Return if symptoms worsen or fail to improve.   Tanda Raguel SQUIBB, MD

## 2023-10-12 ENCOUNTER — Encounter

## 2023-10-26 ENCOUNTER — Ambulatory Visit: Admitting: Nurse Practitioner

## 2023-11-16 ENCOUNTER — Telehealth: Admitting: Physician Assistant

## 2023-11-16 DIAGNOSIS — K047 Periapical abscess without sinus: Secondary | ICD-10-CM | POA: Diagnosis not present

## 2023-11-16 MED ORDER — CLINDAMYCIN HCL 300 MG PO CAPS: 300.0000 mg | ORAL_CAPSULE | Freq: Three times a day (TID) | ORAL | 0 refills | Status: AC

## 2023-11-16 NOTE — Progress Notes (Signed)

## 2023-12-07 ENCOUNTER — Ambulatory Visit: Admitting: Nurse Practitioner

## 2023-12-07 ENCOUNTER — Encounter: Payer: Self-pay | Admitting: Nurse Practitioner

## 2023-12-07 VITALS — BP 110/80 | HR 74 | Resp 16 | Wt 316.0 lb

## 2023-12-07 DIAGNOSIS — N921 Excessive and frequent menstruation with irregular cycle: Secondary | ICD-10-CM | POA: Diagnosis not present

## 2023-12-07 DIAGNOSIS — Z6841 Body Mass Index (BMI) 40.0 and over, adult: Secondary | ICD-10-CM

## 2023-12-07 DIAGNOSIS — Z130 Encounter for screening for diseases of the blood and blood-forming organs and certain disorders involving the immune mechanism: Secondary | ICD-10-CM

## 2023-12-07 DIAGNOSIS — Z23 Encounter for immunization: Secondary | ICD-10-CM

## 2023-12-07 DIAGNOSIS — D219 Benign neoplasm of connective and other soft tissue, unspecified: Secondary | ICD-10-CM

## 2023-12-07 DIAGNOSIS — Z789 Other specified health status: Secondary | ICD-10-CM | POA: Diagnosis not present

## 2023-12-07 DIAGNOSIS — E282 Polycystic ovarian syndrome: Secondary | ICD-10-CM

## 2023-12-07 NOTE — Progress Notes (Signed)
   Acute Office Visit  Subjective:    Patient ID: Melody Gates, female    DOB: [birth year], 33 y.o.   MRN: [id number]   HPI 33 y.o. G0 presents today as new patient for abnormal bleeding. Referred by PCP. LMP around 08/09/23 and lasted 77 days. H/O PCOS. Menarche age 48, normal first 2 years and has had period about 2 times per year since. Has tried multiple treatments - Norethindrone (bled constantly even on triple dose), Metformin (GI symptoms), Provera (did not suppress bleeding), Myfembree. Had an ultrasound over a year ago. Reports having one fibroid. Was previously seen at [facility]. Sister just had fibroid surgery. Normal TSH. Sexually active. Has been trying to conceive for 2.5 years. HTN. Has received one Gardasil 08/16/23. Would like to complete series.   Patient's last menstrual period was 08/09/2023 (exact date). Period Duration (Days):  (varies) Period Pattern: (!) Irregular Menstrual Flow: Heavy Menstrual Control: Maxi pad Dysmenorrhea: None  Review of Systems  Constitutional: Negative.   Genitourinary:  Positive for menstrual problem.       Objective:    Physical Exam Constitutional:      Appearance: Normal appearance. She is obese.   GU: Not indicated  BP 110/80   Pulse 74   Resp 16   Wt (!) 316 lb (143.3 kg)   LMP 08/09/2023 (Exact Date)   BMI 51.00 kg/m  Wt Readings from Last 3 Encounters:  12/07/23 (!) 316 lb (143.3 kg)  10/03/23 (!) 310 lb 6.4 oz (140.8 kg)  05/03/23 (!) 314 lb 12.8 oz (142.8 kg)       Assessment & Plan:   Problem List Items Addressed This Visit       Endocrine   PCOS (polycystic ovarian syndrome)   Other Visit Diagnoses       BMI 50.0-59.9, adult (HCC)    -  Primary   Relevant Orders   Hemoglobin A1c     Menometrorrhagia       Relevant Orders   US  PELVIS TRANSVAGINAL NON-OB (TV ONLY)     Screening for deficiency anemia       Relevant Orders   CBC with Differential/Platelet   Iron , TIBC and Ferritin Panel      Attempting to conceive         Fibroid       Relevant Orders   US  PELVIS TRANSVAGINAL NON-OB (TV ONLY)     Need for HPV vaccination       Relevant Orders   HPV 9-valent vaccine,Recombinat (Completed)      Plan: Will send referral to reproductive endocrinology. Repeat US . Wants this done here. Check iron  panel, CBC. Return in 4 months for last HPV. Records requested.       Melody DELENA Shutter DNP, 3:35 PM 12/07/2023

## 2023-12-08 LAB — IRON,TIBC AND FERRITIN PANEL
%SAT: 6 % — ABNORMAL LOW (ref 16–45)
Ferritin: 16 ng/mL (ref 16–154)
Iron: 22 ug/dL — ABNORMAL LOW (ref 40–190)
TIBC: 359 ug/dL (ref 250–450)

## 2023-12-08 LAB — HEMOGLOBIN A1C
Hgb A1c MFr Bld: 5.9 % — ABNORMAL HIGH (ref <5.7)
Mean Plasma Glucose: 123 mg/dL
eAG (mmol/L): 6.8 mmol/L

## 2023-12-08 LAB — CBC WITH DIFFERENTIAL/PLATELET
Absolute Lymphocytes: 3922 {cells}/uL — ABNORMAL HIGH (ref 850–3900)
Absolute Monocytes: 829 {cells}/uL (ref 200–950)
Basophils Absolute: 59 {cells}/uL (ref 0–200)
Basophils Relative: 0.4 %
Eosinophils Absolute: 326 {cells}/uL (ref 15–500)
Eosinophils Relative: 2.2 %
HCT: 35 % (ref 35.0–45.0)
Hemoglobin: 10.5 g/dL — ABNORMAL LOW (ref 11.7–15.5)
MCH: 21 pg — ABNORMAL LOW (ref 27.0–33.0)
MCHC: 30 g/dL — ABNORMAL LOW (ref 32.0–36.0)
MCV: 70 fL — ABNORMAL LOW (ref 80.0–100.0)
MPV: 12.3 fL (ref 7.5–12.5)
Monocytes Relative: 5.6 %
Neutro Abs: 9664 {cells}/uL — ABNORMAL HIGH (ref 1500–7800)
Neutrophils Relative %: 65.3 %
Platelets: 453 Thousand/uL — ABNORMAL HIGH (ref 140–400)
RBC: 5 Million/uL (ref 3.80–5.10)
RDW: 16.4 % — ABNORMAL HIGH (ref 11.0–15.0)
Total Lymphocyte: 26.5 %
WBC: 14.8 Thousand/uL — ABNORMAL HIGH (ref 3.8–10.8)

## 2023-12-08 LAB — CBC MORPHOLOGY

## 2023-12-11 ENCOUNTER — Other Ambulatory Visit: Payer: Self-pay | Admitting: Nurse Practitioner

## 2023-12-11 ENCOUNTER — Ambulatory Visit: Payer: Self-pay | Admitting: Nurse Practitioner

## 2023-12-11 DIAGNOSIS — E282 Polycystic ovarian syndrome: Secondary | ICD-10-CM

## 2023-12-11 DIAGNOSIS — D500 Iron deficiency anemia secondary to blood loss (chronic): Secondary | ICD-10-CM

## 2023-12-11 DIAGNOSIS — R7303 Prediabetes: Secondary | ICD-10-CM

## 2023-12-11 MED ORDER — FERROUS SULFATE 325 (65 FE) MG PO TBEC: 325.0000 mg | DELAYED_RELEASE_TABLET | Freq: Two times a day (BID) | ORAL | 0 refills | Status: AC

## 2023-12-11 MED ORDER — METFORMIN HCL ER 500 MG PO TB24: 500.0000 mg | ORAL_TABLET | Freq: Every day | ORAL | 2 refills | Status: AC

## 2023-12-13 NOTE — Progress Notes (Signed)
 Call placed to [facility], was advised Dr. Amadeo has retired. New referral placed and authorized. Left message on referral line requesting patient be contacted directly to schedule.   Routing to [employer].   MyChart message to patient.

## 2023-12-19 NOTE — Progress Notes (Unsigned)
   Acute Office Visit  Subjective:    Patient ID: Unita Marinez, female    DOB: 05/12/1990, 33 y.o.   MRN: [id number]   HPI 33 y.o. presents today for ultrasound. Seen 12/07/23 for abnormal bleeding. Referred by PCP. LMP around 08/09/23 and lasted 77 days. H/O PCOS. Menarche age 66, normal first 2 years and has had period about 2 times per year since. Has tried multiple treatments - Norethindrone (bled constantly even on triple dose), Metformin (GI symptoms), Provera (did not suppress bleeding), Myfembree. Had an ultrasound over a year ago. Reports having one fibroid. Was previously seen at [facility]. Sister just had fibroid surgery. Normal TSH. Sexually active. Has been trying to conceive for 2.5 years. Referred to reproductive endocrinology. Also referred back to hematology for management of anemia.   Patient's last menstrual period was 08/09/2023 (exact date).    Review of Systems     Objective:    Physical Exam  LMP 08/09/2023 (Exact Date)  Wt Readings from Last 3 Encounters:  12/07/23 (!) 316 lb (143.3 kg)  10/03/23 (!) 310 lb 6.4 oz (140.8 kg)  05/03/23 (!) 314 lb 12.8 oz (142.8 kg)        Zada Louder, CMA present as chaperone.   Assessment & Plan:   Problem List Items Addressed This Visit   None   No follow-ups on file.    Annabella DELENA Shutter DNP, 10:55 AM 12/19/2023

## 2023-12-20 ENCOUNTER — Ambulatory Visit (INDEPENDENT_AMBULATORY_CARE_PROVIDER_SITE_OTHER): Admitting: Nurse Practitioner

## 2023-12-20 ENCOUNTER — Other Ambulatory Visit

## 2023-12-20 ENCOUNTER — Encounter: Payer: Self-pay | Admitting: Nurse Practitioner

## 2023-12-20 VITALS — BP 122/80 | HR 86

## 2023-12-20 DIAGNOSIS — D219 Benign neoplasm of connective and other soft tissue, unspecified: Secondary | ICD-10-CM

## 2023-12-20 DIAGNOSIS — N921 Excessive and frequent menstruation with irregular cycle: Secondary | ICD-10-CM

## 2023-12-20 DIAGNOSIS — E282 Polycystic ovarian syndrome: Secondary | ICD-10-CM

## 2023-12-29 ENCOUNTER — Encounter

## 2024-01-04 ENCOUNTER — Inpatient Hospital Stay

## 2024-01-04 ENCOUNTER — Inpatient Hospital Stay: Admitting: Oncology

## 2024-01-04 ENCOUNTER — Telehealth: Admitting: Physician Assistant

## 2024-01-04 DIAGNOSIS — L700 Acne vulgaris: Secondary | ICD-10-CM

## 2024-01-04 MED ORDER — CLINDAMYCIN PHOS-BENZOYL PEROX 1.2-5 % EX GEL
CUTANEOUS | 0 refills | Status: AC
Start: 2024-01-04 — End: ?

## 2024-01-04 NOTE — Progress Notes (Signed)
 We are sorry that you are experiencing this issue.  Here is how we plan to help!  Based on what you shared with me it looks like you have uncomplicated acne.  Acne is a disorder of the hair follicles and oil glands (sebaceous glands). The sebaceous glands secrete oils to keep the skin moist.  When the glands get clogged, it can lead to pimples or cysts.  These cysts may become infected and leave scars. Acne is very common and normally occurs at puberty.  Acne is also inherited.  Your personal care plan consists of the following recommendations:  I recommend that you use a daily cleanser  You may try a topical exfoliator and salicylic acid scrub.  These scrubs have coarse particles that clear your pores but may also irritate your skin.  I have prescribed a topical gel with an antibiotic:  Clindamycin -benzoyl peroxide gel.  This gel should be applied to the affected areas twice a day.  Be sure to read the package insert to understand potential side effects.  If excessive dryness or peeling occurs, reduce dose frequency or concentration of the topical scrubs.  If excessive stinging or burning occurs, remove the topical gel with mild soap and water and resume at a lower dose the next day.  Remember oral antibiotics and topical acne treatments may increase your sensitivity to the sun!  HOME CARE: Do not squeeze pimples because that can often lead to infections, worse acne, and scars. Use a moisturizer that contains retinoid or fruit acids that may inhibit the development of new acne lesions. Although there is not a clear link that foods can cause acne, doctors do believe that too many sweets predispose you to skin problems.  GET HELP RIGHT AWAY IF: If your acne gets worse or is not better within 10 days. If you become depressed. If you become pregnant, discontinue medications and call your OB/GYN.  MAKE SURE YOU: Understand these instructions. Will watch your condition. Will get help right  away if you are not doing well or get worse.   Your e-visit answers were reviewed by a board certified advanced clinical practitioner to complete your personal care plan.  Depending upon the condition, your plan could have included both over the counter or prescription medications.  Please review your pharmacy choice.  If there is a problem, you may contact your provider through [employer] and have the prescription routed to another pharmacy.  Your safety is important to us .  If you have drug allergies check your prescription carefully.  For the next 24 hours you can use MyChart to ask questions about today's visit, request a non-urgent call back, or ask for a work or school excuse from your e-visit provider.  You will get an email in the next two days asking about your experience. I hope that your e-visit has been valuable and will speed your recovery.  I have spent 5 minutes in review of e-visit questionnaire, review and updating patient chart, medical decision making and response to patient.   Delon CHRISTELLA Dickinson, PA-C

## 2024-01-10 ENCOUNTER — Ambulatory Visit: Admitting: Family Medicine

## 2024-01-17 ENCOUNTER — Inpatient Hospital Stay

## 2024-01-17 ENCOUNTER — Inpatient Hospital Stay: Admitting: Oncology

## 2024-01-17 ENCOUNTER — Telehealth: Payer: Self-pay

## 2024-01-17 NOTE — Telephone Encounter (Signed)
 Left a voicemail with patient due to missed Hematology and Lab appointments with Dr. Autumn. Informed patient that a scheduler may reach out to her to reschedule missed appointments.

## 2024-01-25 ENCOUNTER — Encounter

## 2024-02-18 NOTE — Progress Notes (Deleted)
 [facility]   [phone] Fax:([phone]) [phone]   Clinic New consult Note   Patient Care Team: Tanda Bleacher, MD as PCP - General (Family Medicine) 02/18/2024  CHIEF COMPLAINTS/PURPOSE OF CONSULTATION:  ***Anemia  ASSESSMENT & PLAN:  Melody Gates is a 33 y.o. *** female with a history of ***  1. *** Anemia ***   PLAN: ***   HISTORY OF PRESENTING ILLNESS:  Melody Gates 33 y.o. female is here because of *** anemia.  ***She was found to have abnormal CBC from 12/07/2023.  ***She denies recent chest pain on exertion, shortness of breath on minimal exertion, pre-syncopal episodes, or palpitations. ***She had not noticed any recent bleeding such as epistaxis, hematuria or hematochezia ***The patient denies over the counter NSAID ingestion. She is not *** on antiplatelets agents. Her last colonoscopy was *** ***She had no prior history or diagnosis of cancer. Her age appropriate screening programs are up-to-date. ***She denies any pica and eats a variety of diet. ***She never donated blood or received blood transfusion ***The patient was prescribed oral iron  supplements and she takes ***   REVIEW OF SYSTEMS:   Constitutional: Denies fevers, chills or abnormal night sweats Eyes: Denies blurriness of vision, double vision or watery eyes Ears, nose, mouth, throat, and face: Denies mucositis or sore throat Respiratory: Denies cough, dyspnea or wheezes Cardiovascular: Denies palpitation, chest discomfort or lower extremity swelling Gastrointestinal:  Denies nausea, heartburn or change in bowel habits Skin: Denies abnormal skin rashes Lymphatics: Denies new lymphadenopathy or easy bruising Neurological:Denies numbness, tingling or new weaknesses Behavioral/Psych: Mood is stable, no new changes   All other systems were reviewed with the patient and are negative.   MEDICAL HISTORY:  Past Medical History:  Diagnosis Date   Abnormal Pap smear of  cervix    Anemia    Anxiety    Depression    Fibroid    GERD (gastroesophageal reflux disease)    Hypertension    PCOS (polycystic ovarian syndrome)    STD (sexually transmitted disease)    chlamydia treated yrs ago    SURGICAL HISTORY: Past Surgical History:  Procedure Laterality Date   COLPOSCOPY      SOCIAL HISTORY: Social History   Socioeconomic History   Marital status: Single    Spouse name: Not on file   Number of children: Not on file   Years of education: Not on file   Highest education level: Some college, no degree  Occupational History   Not on file  Tobacco Use   Smoking status: Former   Smokeless tobacco: Never   Tobacco comments:    Only lasted a few months  Vaping Use   Vaping status: Never Used  Substance and Sexual Activity   Alcohol use: Never   Drug use: Never   Sexual activity: Yes    Partners: Male    Birth control/protection: None  Other Topics Concern   Not on file  Social History Narrative   Not on file   Social Drivers of Health   Financial Resource Strain: Low Risk  (10/02/2023)   Overall Financial Resource Strain (CARDIA)    Difficulty of Paying Living Expenses: Not very hard  Food Insecurity: No Food Insecurity (10/02/2023)   Hunger Vital Sign    Worried About Running Out of Food in the Last Year: Never true    Ran Out of Food in the Last Year: Never true  Transportation Needs: No Transportation Needs (10/02/2023)   PRAPARE - Transportation  Lack of Transportation (Medical): No    Lack of Transportation (Non-Medical): No  Physical Activity: Insufficiently Active (10/02/2023)   Exercise Vital Sign    Days of Exercise per Week: 2 days    Minutes of Exercise per Session: 60 min  Stress: Stress Concern Present (10/02/2023)   [employer] of Occupational [employer] - Occupational Stress Questionnaire    Feeling of Stress: To some extent  Social Connections: Socially Isolated (10/02/2023)   Social Connection and Isolation Panel     Frequency of Communication with Friends and Family: More than three times a week    Frequency of Social Gatherings with Friends and Family: Never    Attends Religious Services: Never    Database Administrator or Organizations: No    Attends Engineer, Structural: Not on file    Marital Status: Never married  Catering Manager Violence: Not on file    FAMILY HISTORY: Family History  Problem Relation Age of Onset   Breast cancer Mother 70   Hypertension Mother    Hypertension Brother    Hypertension Brother    Diabetes Maternal Uncle    Diabetes Maternal Grandmother    Stroke Maternal Grandmother     ALLERGIES:  is allergic to penicillins and amoxicillin.  MEDICATIONS:  Current Outpatient Medications  Medication Sig Dispense Refill   Clindamycin -Benzoyl Per, Refr, gel Use topically twice daily 45 g 0   ferrous sulfate  325 (65 FE) MG EC tablet Take 1 tablet (325 mg total) by mouth 2 (two) times daily with a meal. 180 tablet 0   losartan -hydrochlorothiazide (HYZAAR) 100-25 MG tablet TAKE 1 TABLET BY MOUTH EVERY DAY 90 tablet 0   metFORMIN  (GLUCOPHAGE -XR) 500 MG 24 hr tablet Take 1 tablet (500 mg total) by mouth daily with breakfast. 30 tablet 2   No current facility-administered medications for this visit.    PHYSICAL EXAMINATION: ECOG PERFORMANCE STATUS: {CHL ONC ECOG PS:[id number]}  There were no vitals filed for this visit. There were no vitals filed for this visit.  GENERAL:alert, no distress and comfortable SKIN: skin color, texture, turgor are normal, no rashes or significant lesions EYES: normal, conjunctiva are pink and non-injected, sclera clear OROPHARYNX:no exudate, no erythema and lips, buccal mucosa, and tongue normal  NECK: supple, thyroid normal size, non-tender, without nodularity LYMPH:  no palpable lymphadenopathy in the cervical, axillary or inguinal LUNGS: clear to auscultation and percussion with normal breathing effort HEART: regular rate &  rhythm and no murmurs and no lower extremity edema ABDOMEN:abdomen soft, non-tender and normal bowel sounds Musculoskeletal:no cyanosis of digits and no clubbing  PSYCH: alert & oriented x 3 with fluent speech NEURO: no focal motor/sensory deficits  LABORATORY DATA:  I have reviewed the data as listed    Latest Ref Rng & Units 12/07/2023    3:05 PM 10/03/2023   10:16 AM 12/15/2021    8:56 AM  CBC  WBC 3.8 - 10.8 Thousand/uL 14.8  14.9  CANCELED   Hemoglobin 11.7 - 15.5 g/dL 89.4  88.3  CANCELED   Hematocrit 35.0 - 45.0 % 35.0  41.0  CANCELED   Platelets 140 - 400 Thousand/uL 453  481  CANCELED        Latest Ref Rng & Units 10/03/2023   10:16 AM 12/15/2021    8:56 AM 12/15/2019    4:57 PM  CMP  Glucose 70 - 99 mg/dL 77  86  883   BUN 6 - 20 mg/dL 10  8  14    Creatinine  0.57 - 1.00 mg/dL 9.26  9.25  9.20   Sodium 134 - 144 mmol/L 136  140  133   Potassium 3.5 - 5.2 mmol/L 4.9  3.8  3.7   Chloride 96 - 106 mmol/L 100  103  101   CO2 20 - 29 mmol/L 20  21  23    Calcium 8.7 - 10.2 mg/dL 9.9  9.5  9.5   Total Protein 6.0 - 8.5 g/dL  7.2  8.6   Total Bilirubin 0.0 - 1.2 mg/dL  0.2  0.4   Alkaline Phos 44 - 121 IU/L  72  45   AST 0 - 40 IU/L  15  12   ALT 0 - 32 IU/L  10  11      RADIOGRAPHIC STUDIES: I have personally reviewed the radiological images as listed and agreed with the findings in the report. No results found.  No orders of the defined types were placed in this encounter.   All questions were answered. The patient knows to call the clinic with any problems, questions or concerns. The total time spent in the appointment was {CHL ONC TIME VISIT - DTPQU:[id number]}.     Powell FORBES Lessen, NP 02/18/2024 10:41 PM  I, Izetta Neither, am acting as scribe for Onita Mattock, MD.   {Add scribe attestation statement}

## 2024-02-19 ENCOUNTER — Inpatient Hospital Stay

## 2024-02-19 ENCOUNTER — Inpatient Hospital Stay: Attending: Nurse Practitioner | Admitting: Nurse Practitioner

## 2024-02-19 DIAGNOSIS — D500 Iron deficiency anemia secondary to blood loss (chronic): Secondary | ICD-10-CM

## 2024-03-11 ENCOUNTER — Other Ambulatory Visit

## 2024-03-18 ENCOUNTER — Telehealth: Admitting: Nurse Practitioner

## 2024-03-18 DIAGNOSIS — J301 Allergic rhinitis due to pollen: Secondary | ICD-10-CM

## 2024-03-18 MED ORDER — FEXOFENADINE HCL 180 MG PO TABS: 180.0000 mg | ORAL_TABLET | Freq: Every day | ORAL | 0 refills | Status: DC

## 2024-03-18 MED ORDER — FLUTICASONE PROPIONATE 50 MCG/ACT NA SUSP: 2.0000 | Freq: Every day | NASAL | 0 refills | Status: AC

## 2024-03-18 MED ORDER — OLOPATADINE HCL 0.1 % OP SOLN: 1.0000 [drp] | Freq: Two times a day (BID) | OPHTHALMIC | 0 refills | Status: AC

## 2024-03-18 NOTE — Progress Notes (Signed)
 E visit for Allergic Rhinitis We are sorry that you are not feeling well.  Here is how we plan to help!  Based on what you have shared with me, it looks like you have Allergic Rhinitis.  Rhinitis is when a reaction occurs that causes nasal congestion, runny nose, sneezing, and itching.    There are several types of rhinitis, with the most common being acute rhinitis, allergic rhinitis, and nonallergic rhinitis. Acute rhinitis is typically caused by a viral infection. Allergic rhinitis, also known as seasonal rhinitis, occurs at certain times of the year when airborne allergens--such as pollen--trigger the immune system to release histamine. This chemical causes symptoms like itching, swelling, and fluid buildup in the delicate linings of the nasal passages, sinuses, and eyelids. As a result, individuals often experience an itchy nose and a clear nasal discharge. Nonallergic rhinitis, on the other hand, is not triggered by allergens and tends to persist year-round.   You should take a daily antihistamine. I have prescribed:Fexofenadine  180 mg -- Take 1 tablet daily.  A nasal spray would also be beneficial. I have prescribed:Flonase  --  2 sprays into each nostril once daily.  You may also benefit from eye drops such as: Olopatadine  0.1%  1 drop into each eye twice daily. This prescription has been sent to your pharmacy.  HOME CARE:  You can use an over-the-counter saline nasal spray as needed. Avoid areas where there is heavy dust, mites, or molds. Stay indoors on windy days during the pollen season. Keep windows closed in home, at least in bedroom; use air conditioner. Keep house pets out of the bedroom or at least off of your bed. Use high-efficiency house air filters in the home. Keep windows closed in car, turn AC on re-circulate. Avoid playing outside with animals during peak pollen season.  GET HELP RIGHT AWAY IF:  If your symptoms do not improve within 10 days. You become short of  breath. You develop yellow or green discharge from your nose for over 3 days. You have coughing fits.  MAKE SURE YOU:  Understand these instructions. Monitor your symptoms and get help right away if anything continues to worsen despite treatment.  Thank you for choosing an e-visit.   Your e-visit answers were reviewed by a board certified advanced clinical practitioner to complete your personal care plan. Depending upon the condition, your plan could have included both over the counter or prescription medications.   Please review your pharmacy choice. Be sure that the pharmacy you have chosen is open so that you can pick up your prescription now.  If there is a problem, you may message your provider in MyChart to have the prescription routed to another pharmacy.   Your safety is important to us . If you have drug allergies, check your prescription carefully.    For the next 24 hours, you can use MyChart to ask questions about todays visit, request a non-urgent call back, or ask for a work or school excuse from your e-visit provider.   You will receive an email in the next two days asking about your experience. I hope that your e-visit has been valuable and will speed up your recovery.    I have spent 5 minutes in review of e-visit questionnaire, review and updating patient chart, medical decision making and response to patient.   Delon CHRISTELLA Dickinson, PA-C

## 2024-03-19 DIAGNOSIS — J301 Allergic rhinitis due to pollen: Secondary | ICD-10-CM

## 2024-03-19 MED ORDER — FEXOFENADINE HCL 180 MG PO TABS: 180.0000 mg | ORAL_TABLET | Freq: Every day | ORAL | 0 refills | Status: AC

## 2024-03-19 NOTE — Progress Notes (Signed)
 [facility]
